# Patient Record
Sex: Male | Born: 1965
Health system: Southern US, Community
[De-identification: ages and names within clinical notes are randomized; demographics above are authoritative.]

## PROBLEM LIST (undated history)

## (undated) DIAGNOSIS — E782 Mixed hyperlipidemia: Secondary | ICD-10-CM

## (undated) HISTORY — PX: SHOULDER SURGERY: SHX246

## (undated) HISTORY — DX: Mixed hyperlipidemia: E78.2

## (undated) HISTORY — PX: HIP SURGERY: SHX245

---

## 1999-01-23 ENCOUNTER — Emergency Department (HOSPITAL_COMMUNITY): Admission: EM | Admit: 1999-01-23 | Discharge: 1999-01-23 | Payer: Self-pay | Admitting: Emergency Medicine

## 1999-01-23 ENCOUNTER — Encounter: Payer: Self-pay | Admitting: Emergency Medicine

## 2003-06-09 ENCOUNTER — Emergency Department (HOSPITAL_COMMUNITY): Admission: EM | Admit: 2003-06-09 | Discharge: 2003-06-09 | Payer: Self-pay | Admitting: Emergency Medicine

## 2012-09-27 ENCOUNTER — Ambulatory Visit: Payer: Self-pay

## 2012-09-27 ENCOUNTER — Ambulatory Visit: Payer: Self-pay | Admitting: Family Medicine

## 2012-09-27 DIAGNOSIS — R109 Unspecified abdominal pain: Secondary | ICD-10-CM

## 2012-09-27 MED ORDER — PREDNISONE 20 MG PO TABS: ORAL_TABLET | ORAL | Status: DC

## 2012-09-27 NOTE — Progress Notes (Signed)
  Subjective:    Patient ID: Alex Villegas, male    DOB: 02-May-1965, 47 y.o.   MRN: [id number]  HPI 47 y.o. Male presents to clinic with lower abdominal pain that he has noticed for the past two months. Pain predominantly in the center of lower abdomen. Has slight pain with coughing. For the past two weeks that pain has been feeling worse intromittently. Denies any nausea, vomiting.  Has some pain with walking. Patient playing soccer, denies having been hit.   Review of Systems     Objective:   Physical Exam Very tender symphysis pubis Abdomen otherwise negative Normal hip ROM [facility] reading (PRIMARY) by  Dr. Milus Glazier: small irregularity right pubic symphysis..         Assessment & Plan:  Lower abdominal pain, unspecified laterality - Plan: DG Pelvis 1-2 Views, predniSONE (DELTASONE) 20 MG tablet Strained rectus at symphysis Signed, Elvina Sidle, MD

## 2012-12-14 ENCOUNTER — Other Ambulatory Visit: Payer: Self-pay | Admitting: Urology

## 2012-12-14 DIAGNOSIS — M25551 Pain in right hip: Secondary | ICD-10-CM

## 2012-12-16 ENCOUNTER — Ambulatory Visit
Admission: RE | Admit: 2012-12-16 | Discharge: 2012-12-16 | Disposition: A | Discharge status: Home / Self Care | Payer: PRIVATE HEALTH INSURANCE | Source: Ambulatory Visit | Attending: Urology | Admitting: Urology

## 2012-12-16 DIAGNOSIS — M25551 Pain in right hip: Secondary | ICD-10-CM

## 2015-03-21 LAB — LIPID PANEL
Cholesterol: 243 — AB (ref 0–200)
HDL: 35 (ref 35–70)
LDL CALC: 137
Triglycerides: 351 — AB (ref 40–160)

## 2015-03-21 LAB — BASIC METABOLIC PANEL: Glucose: 109

## 2015-10-28 DIAGNOSIS — M1611 Unilateral primary osteoarthritis, right hip: Secondary | ICD-10-CM | POA: Insufficient documentation

## 2016-05-04 LAB — HM COLONOSCOPY

## 2016-07-22 LAB — LIPID PANEL
CHOLESTEROL: 247 — AB (ref 0–200)
HDL: 30 — AB (ref 35–70)
LDL Cholesterol: 162
Triglycerides: 276 — AB (ref 40–160)

## 2016-07-22 LAB — BASIC METABOLIC PANEL: GLUCOSE: 87

## 2016-10-05 ENCOUNTER — Ambulatory Visit (INDEPENDENT_AMBULATORY_CARE_PROVIDER_SITE_OTHER): Payer: Commercial Managed Care - PPO | Admitting: Physician Assistant

## 2016-10-05 ENCOUNTER — Encounter: Payer: Self-pay | Admitting: Physician Assistant

## 2016-10-05 VITALS — BP 110/70 | HR 52 | Temp 97.8°F | Ht 69.5 in | Wt 172.4 lb

## 2016-10-05 DIAGNOSIS — R0789 Other chest pain: Secondary | ICD-10-CM

## 2016-10-05 DIAGNOSIS — Z114 Encounter for screening for human immunodeficiency virus [HIV]: Secondary | ICD-10-CM | POA: Diagnosis not present

## 2016-10-05 DIAGNOSIS — E782 Mixed hyperlipidemia: Secondary | ICD-10-CM | POA: Diagnosis not present

## 2016-10-05 LAB — LIPID PANEL
Cholesterol: 210 mg/dL — ABNORMAL HIGH (ref 0–200)
HDL: 37.1 mg/dL — ABNORMAL LOW (ref >39.00)
LDL Cholesterol: 133 mg/dL — ABNORMAL HIGH (ref 0–99)
NonHDL: 172.85
Total CHOL/HDL Ratio: 6
Triglycerides: 199 mg/dL — ABNORMAL HIGH (ref 0.0–149.0)
VLDL: 39.8 mg/dL (ref 0.0–40.0)

## 2016-10-05 LAB — CBC WITH DIFFERENTIAL/PLATELET
Basophils Absolute: 0 10*3/uL (ref 0.0–0.1)
Basophils Relative: 0.8 % (ref 0.0–3.0)
Eosinophils Absolute: 0.1 10*3/uL (ref 0.0–0.7)
Eosinophils Relative: 1.7 % (ref 0.0–5.0)
HCT: 43.7 % (ref 39.0–52.0)
Hemoglobin: 15.1 g/dL (ref 13.0–17.0)
Lymphocytes Relative: 49.9 % — ABNORMAL HIGH (ref 12.0–46.0)
Lymphs Abs: 2.2 10*3/uL (ref 0.7–4.0)
MCHC: 34.5 g/dL (ref 30.0–36.0)
MCV: 88.5 fl (ref 78.0–100.0)
Monocytes Absolute: 0.3 10*3/uL (ref 0.1–1.0)
Monocytes Relative: 6.6 % (ref 3.0–12.0)
Neutro Abs: 1.8 10*3/uL (ref 1.4–7.7)
Neutrophils Relative %: 41 % — ABNORMAL LOW (ref 43.0–77.0)
Platelets: 169 10*3/uL (ref 150.0–400.0)
RBC: 4.94 Mil/uL (ref 4.22–5.81)
RDW: 13.2 % (ref 11.5–15.5)
WBC: 4.4 10*3/uL (ref 4.0–10.5)

## 2016-10-05 LAB — COMPREHENSIVE METABOLIC PANEL
ALT: 21 U/L (ref 0–53)
AST: 17 U/L (ref 0–37)
Albumin: 4.5 g/dL (ref 3.5–5.2)
Alkaline Phosphatase: 75 U/L (ref 39–117)
BUN: 16 mg/dL (ref 6–23)
CO2: 28 mEq/L (ref 19–32)
Calcium: 9.4 mg/dL (ref 8.4–10.5)
Chloride: 103 mEq/L (ref 96–112)
Creatinine, Ser: 1.03 mg/dL (ref 0.40–1.50)
GFR: 81.04 mL/min (ref >60.00)
Glucose, Bld: 93 mg/dL (ref 70–99)
Potassium: 4 mEq/L (ref 3.5–5.1)
Sodium: 140 mEq/L (ref 135–145)
Total Bilirubin: 0.8 mg/dL (ref 0.2–1.2)
Total Protein: 7.1 g/dL (ref 6.0–8.3)

## 2016-10-05 LAB — TSH: TSH: 1.22 u[IU]/mL (ref 0.35–4.50)

## 2016-10-05 NOTE — Patient Instructions (Signed)
It was great to meet you!  We will call you with your lab results.  If you develop any changes in your chest pain or more severe symptoms, please go to the emergency room.

## 2016-10-05 NOTE — Progress Notes (Signed)
Alex Villegas is a 51 y.o. male here to Establish Care  I acted as a Neurosurgeon for [employer], PA-C Alex Mull, LPN  History of Present Illness:   Chief Complaint  Patient presents with  . Establish Care    Generic Commercial    Acute Concerns: Patient presents with complaints of chest pain.  Provocative/Palliative factors: breathing deep makes it better, nothing makes worse Quality factors: dull Radiation: no radiation Risk factors: no family history, does not smoke, 1 cup of coffee daily, couple of beers weekly, endorses very little stress in life Severity (1/10): 1-2 Site of pain: chest Timing: no pattern, never wakes up out of sleep Constant or intermittent: intermittent Abrupt or gradual onset: gradual Other sx: no diaphoresis, nausea, dyspnea  Has this happened before? Has been dealing with for at least 10 years, 3 years ago went to [place], had EKG at cardiologist office, was told it was normal  He works for [employer] They do annual nursing visits where they get vitals and blood work done. His most recent lipid panel shows LDL in the 160's, the year prior was in the 130's. He has taken fish oil in the past, does not know dosage or when he last took any. He plays soccer for exercise, does not have any symptoms with this.  Chronic Issues: None  Health Maintenance: Immunizations -- declined Tdap and flu shot Colonoscopy -- requesting records, was completed this year --- told to repeat in 5 years PSA -- reports that he had PSA completed at last physical, approx 3-4 months ago Diet -- "[place] foods", eats all food groups, drinks mostly sweet tea and orange juice Caffeine intake -- 1 coffee daily Exercise -- soccer a few days a week Weight -- Weight: 172 lb 6.1 oz (78.2 kg)  Mood -- denies stress/anxiety  Depression screen PHQ 2/9 10/05/2016  Decreased Interest 0  Down, Depressed, Hopeless 0  PHQ - 2 Score 0    No flowsheet data found.  Other  providers/specialists: Saw cardiologist in [place] a few years ago  History reviewed. No pertinent past medical history.   Social History   Social History  . Marital status: Single    Spouse name: N/A  . Number of children: N/A  . Years of education: N/A   Occupational History  . Not on file.   Social History Main Topics  . Smoking status: Never Smoker  . Smokeless tobacco: Never Used  . Alcohol use 0.6 oz/week    1 Cans of beer per week  . Drug use: No  . Sexual activity: No   Other Topics Concern  . Not on file   Social History Narrative   Single   2 children   Works at [employer], Artist       Past Surgical History:  Procedure Laterality Date  . HIP SURGERY Right   . SHOULDER SURGERY Right     Family History  Problem Relation Age of Onset  . Diabetes Mother   . Diabetes Sister     No Known Allergies   Current Medications:   Current Outpatient Prescriptions:  .  acetaminophen (TYLENOL) 325 MG tablet, Take 650 mg by mouth as needed., Disp: , Rfl:    Review of Systems:   Review of Systems  Constitutional: Negative for chills, fever, malaise/fatigue and weight loss.  Respiratory: Negative for shortness of breath.   Cardiovascular: Positive for chest pain. Negative for palpitations, orthopnea, claudication and leg swelling.  Gastrointestinal: Negative for  heartburn, nausea and vomiting.  Neurological: Negative for dizziness, tingling and headaches.  Psychiatric/Behavioral: The patient is not nervous/anxious and does not have insomnia.     Vitals:   Vitals:   10/05/16 1020  BP: 110/70  Pulse: (!) 52  Temp: 97.8 F (36.6 C)  TempSrc: Oral  SpO2: 96%  Weight: 172 lb 6.1 oz (78.2 kg)  Height: 5' 9.5" (1.765 m)     Body mass index is 25.09 kg/m.  Physical Exam:   Physical Exam  Constitutional: He appears well-developed. He is cooperative.  Non-toxic appearance. He does not have a sickly appearance. He does not appear ill. No  distress.  Cardiovascular: Regular rhythm, S1 normal, S2 normal, normal heart sounds and normal pulses.  Bradycardia present.   No LE edema  Pulmonary/Chest: Effort normal and breath sounds normal.  Neurological: He is alert. GCS eye subscore is 4. GCS verbal subscore is 5. GCS motor subscore is 6.  Skin: Skin is warm, dry and intact.  Psychiatric: He has a normal mood and affect. His speech is normal and behavior is normal.  Nursing note and vitals reviewed.  EKG tracing is personally reviewed.  EKG notes NSR with bradycardia.  No acute changes.   Assessment and Plan:    Alex Villegas was seen today for establish care.  Diagnoses and all orders for this visit:  Chest discomfort This is a chronic issue for patient. Currently rates his pain as 1-2/10. Non-exertional. No family cardiac history. Denies anxiety. Will obtain labs today. Reviewed ER precautions. EKG tracing is personally reviewed.  EKG notes NSR with bradycardia.  No acute changes. Consider cardiology referral if symptoms persist. -     CBC with Differential/Platelet -     Comprehensive metabolic panel -     TSH -     EKG 12-Lead  Elevated cholesterol with elevated triglycerides Will check fasting lipid panel today. Patient is agreeable to starting fish oil if needed. Will calculate ASCVD+ when labs return and assess need for statin. -     Lipid panel  Encounter for screening for HIV Agreeable to one time screening for HIV. -     HIV antibody   . Reviewed expectations re: course of current medical issues. . Discussed self-management of symptoms. . Outlined signs and symptoms indicating need for more acute intervention. . Patient verbalized understanding and all questions were answered. . See orders for this visit as documented in the electronic medical record. . Patient received an After-Visit Summary.   CMA or LPN served as scribe during this visit. History, Physical, and Plan performed by medical provider. Documentation  and orders reviewed and attested to.   Alex Motto, PA-C

## 2016-10-06 ENCOUNTER — Encounter: Payer: Self-pay | Admitting: Physician Assistant

## 2016-10-06 LAB — HIV ANTIBODY (ROUTINE TESTING W REFLEX): HIV 1&2 Ab, 4th Generation: NONREACTIVE

## 2016-10-07 ENCOUNTER — Telehealth: Payer: Self-pay | Admitting: *Deleted

## 2016-10-07 NOTE — Telephone Encounter (Signed)
Patient called and is returning a left voice message regarding his lab results. Patient states he will call back tomorrow morning. Thank you

## 2016-10-07 NOTE — Telephone Encounter (Signed)
Patient called and states he is returning a message left for him regarding results, Patient is requesting a call back. . His contact number is [phone]. Please advise. Thank you

## 2016-10-09 ENCOUNTER — Other Ambulatory Visit: Payer: Self-pay | Admitting: Physician Assistant

## 2016-10-09 DIAGNOSIS — E782 Mixed hyperlipidemia: Secondary | ICD-10-CM

## 2016-10-09 DIAGNOSIS — R001 Bradycardia, unspecified: Secondary | ICD-10-CM

## 2016-10-09 NOTE — Telephone Encounter (Signed)
See result notes. 

## 2016-11-18 ENCOUNTER — Encounter: Payer: Self-pay | Admitting: Cardiology

## 2016-11-18 ENCOUNTER — Ambulatory Visit: Payer: Commercial Managed Care - PPO | Admitting: Cardiology

## 2016-11-18 VITALS — BP 110/76 | HR 62 | Ht 70.0 in | Wt 173.0 lb

## 2016-11-18 DIAGNOSIS — E782 Mixed hyperlipidemia: Secondary | ICD-10-CM | POA: Insufficient documentation

## 2016-11-18 DIAGNOSIS — R079 Chest pain, unspecified: Secondary | ICD-10-CM

## 2016-11-18 NOTE — Patient Instructions (Addendum)
No medication changes    Schedule at [facility] Your physician has requested that you have cardiac CT calcium scoring. Cardiac computed tomography (CT) is a painless test that uses an x-ray machine to take clear, detailed pictures of your heart. For further information please visit [email or web address]. Please follow instruction sheet as given.  THERE WILL BE $150 CHARGE THAT IS NOT COVERED BY INSURANCE.     Your physician recommends that you schedule a follow-up appointment in 1 MONTH WITH DR HARDING.   .Marland Kitchen

## 2016-11-18 NOTE — Progress Notes (Signed)
PCP: Jarold MottoWorley, Samantha, PA  Clinic Note: Chief Complaint  Patient presents with  . New Patient (Initial Visit)    chest pain    HPI: Alex Villegas is a 51 y.o. male who is being seen today for the evaluation of chest discomfort at the request of Jarold MottoWorley, Samantha, [place].  Alex Villegas was seen on October 1 by Jarold MottoWorley, Samantha, [place] -he noted dealing with chest discomfort for about 10 years.  Was noticing 1-2/10 chest pain at that time.  Exertional in nature.  She showed sinus bradycardia but otherwise normal years ago he was evaluated in cardiologist office in [place] -- the EKG was normal  Recent Hospitalizations: n/a  Studies Personally Reviewed - (if available, images/films reviewed: From Epic Chart or Care Everywhere)  none  Interval History: Alex Villegas presents here today for evaluation of episodic chest discomfort has been off and on for the last several years, but worse over the last couple months.  He is a very active, plays soccer all the time and has no major issues with playing soccer, but of late in the last 3-4 days he has been noticing having some chest discomfort that comes out of the blue.  It can happen both at rest and also sometimes with exertion.  Sometimes he notes it gets worse with exertion and others at times he does not.  When she does feel the pain and it is a sharp stabbing type pain in the center of his chest.  It resolved spontaneously.  He does appear to be better when he takes a deep breath in. He says he notes it more at rest, but if he has the symptom it will get worse if he exerts. He does not have any palpitations or dyspnea associated, nor nausea vomiting.  He has not had any heart failure symptoms of PND, orthopnea or edema. Besides an episode about 6 months ago where he had profound tunnel vision and lightheadedness  -> noting that he went out and played a soccer game without having had enough to drink or eat, he has not had any further episodes of  lightheadedness, dizziness or syncope/near syncope. No TIA/amaurosis fugax symptoms. No melena, hematochezia, hematuria, or epstaxis. No claudication.  ROS: A comprehensive was performed. Review of Systems  Constitutional: Negative for malaise/fatigue.  HENT: Positive for congestion (Sometimes with allergies, but not currently).   Eyes: Negative.   Respiratory: Negative for cough, shortness of breath and wheezing.   Gastrointestinal: Negative for abdominal pain and heartburn.  Musculoskeletal: Positive for joint pain (He has injury to his right ankle from a soccer game.).  Neurological: Negative for dizziness.  Psychiatric/Behavioral: Negative.   All other systems reviewed and are negative.  I have reviewed and (if needed) personally updated the patient's problem list, medications, allergies, past medical and surgical history, social and family history.   Past Medical History:  Diagnosis Date  . Hyperlipidemia, mixed    Total cholesterol 210, LDL 133, triglycerides 199 HDL 37    Past Surgical History:  Procedure Laterality Date  . HIP SURGERY Right   . SHOULDER SURGERY Right     Current Meds  Medication Sig  . acetaminophen (TYLENOL) 325 MG tablet Take 650 mg by mouth as needed.  -He does not take any prescription medications  No Known Allergies  Social History   Socioeconomic History  . Marital status: Single    Spouse name: None  . Number of children: None  . Years of education: None  .  Highest education level: None  Social Needs  . Financial resource strain: None  . Food insecurity - worry: None  . Food insecurity - inability: None  . Transportation needs - medical: None  . Transportation needs - non-medical: None  Occupational History  . None  Tobacco Use  . Smoking status: Current Some Day Smoker  . Smokeless tobacco: Never Used  Substance and Sexual Activity  . Alcohol use: Yes    Alcohol/week: 0.6 oz    Types: 1 Cans of beer per week  . Drug use: No   . Sexual activity: No    Birth control/protection: Abstinence  Other Topics Concern  . 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  Wt Readings from Last 3 Encounters:  11/18/16 173 lb (78.5 kg)  10/05/16 172 lb 6.1 oz (78.2 kg)  09/27/12 175 lb (79.4 kg)    PHYSICAL EXAM BP 110/76   Pulse 62   Ht 5\' 10"  (1.778 m)   Wt 173 lb (78.5 kg)   BMI 24.82 kg/m  Physical Exam  Constitutional: He is oriented to person, place, and time. He appears well-developed and well-nourished. No distress.  HENT:  Head: Normocephalic and atraumatic.  Eyes: EOM are normal. Pupils are equal, round, and reactive to light. No scleral icterus.  Neck: Normal range of motion. Neck supple. No hepatojugular reflux and no JVD present. Carotid bruit is not present. No tracheal deviation present. No thyromegaly present.  Cardiovascular: Normal rate, regular rhythm, normal heart sounds and intact distal pulses.  No extrasystoles are present. PMI is not displaced. Exam reveals no gallop and no friction rub.  No murmur heard. Pulmonary/Chest: Effort normal and breath sounds normal. No respiratory distress. He has no wheezes. He has no rales.  Abdominal: Soft. Bowel sounds are normal. He exhibits no distension. There is no tenderness. There is no rebound.  Musculoskeletal: Normal range of motion. He exhibits no edema.  Neurological: He is alert and oriented to person, place, and time. No cranial nerve deficit.  Skin: Skin is warm and dry. No rash noted. No erythema.  Psychiatric: He has a normal mood and affect. His behavior is normal. Judgment and thought content normal.  Nursing note and vitals reviewed.    Adult ECG  Report  Rate: 62;  Rhythm: normal sinus rhythm and Normal axis, intervals and durations.;   Narrative Interpretation: Normal EKG   Other studies Reviewed: Additional studies/ records that were reviewed today include:  Recent Labs:   Lab Results  Component Value Date   CHOL 210 (H) 10/05/2016   HDL 37.10 (L) 10/05/2016   LDLCALC 133 (H) 10/05/2016   TRIG 199.0 (H) 10/05/2016   CHOLHDL 6 10/05/2016   Lab Results  Component Value Date   CREATININE 1.03 10/05/2016   BUN 16 10/05/2016   NA 140 10/05/2016   K 4.0 10/05/2016   CL 103 10/05/2016   CO2 28 10/05/2016    ASSESSMENT / PLAN:  Otherwise healthy gentleman with no significant cardiac history to speak of who is now presented with some typical and otherwise atypical chest discomfort symptoms.  The fact that he is able to play soccer  most days without having this discomfort makes it less likely for to be significant cardiac disease.  Plan for now will be baseline risk stratification with cardiac calcium scoring.  -->  If abnormal, would then proceed with coronary CT angiogram and possible CT FFR versus Myoview stress test.  He does have hyperlipidemia, and based on the results of the calcium score, may need to consider closer monitoring and treatment.  Problem List Items Addressed This Visit    Chest pain with low risk for cardiac etiology - Primary   Relevant Orders   EKG 12-Lead (Completed)   CT CARDIAC SCORING   Hyperlipidemia, mixed      Current medicines are reviewed at length with the patient today. (+/- concerns) none The following changes have been made:None  Patient Instructions  No medication changes    Schedule at [facility] [address] Your physician has requested that you have cardiac CT calcium scoring. Cardiac computed tomography (CT) is a painless test that uses an x-ray machine to take clear, detailed pictures of your heart. For further information please visit [email or web address]  Please follow instruction sheet as given.  THERE WILL BE $150 CHARGE THAT IS NOT COVERED BY INSURANCE.     Your physician recommends that you schedule a follow-up appointment in 1 MONTH WITH DR HARDING.   .     Studies Ordered:   Orders Placed This Encounter  Procedures  . CT CARDIAC SCORING  . EKG 12-Lead      Bryan Lemma, M.D., M.S. Interventional Cardiologist   Pager # [phone] Phone # [phone] [address] [place], [place] [address]

## 2016-11-20 ENCOUNTER — Encounter: Payer: Self-pay | Admitting: Cardiology

## 2016-11-25 ENCOUNTER — Other Ambulatory Visit: Payer: Commercial Managed Care - PPO

## 2016-12-16 ENCOUNTER — Ambulatory Visit: Payer: Commercial Managed Care - PPO | Admitting: Cardiology

## 2017-04-15 ENCOUNTER — Encounter: Payer: Self-pay | Admitting: Family Medicine

## 2017-04-15 ENCOUNTER — Telehealth: Payer: Self-pay

## 2017-04-15 ENCOUNTER — Ambulatory Visit (INDEPENDENT_AMBULATORY_CARE_PROVIDER_SITE_OTHER): Payer: Commercial Managed Care - PPO | Admitting: Family Medicine

## 2017-04-15 VITALS — BP 118/78 | HR 60 | Temp 98.1°F | Ht 70.0 in | Wt 188.0 lb

## 2017-04-15 DIAGNOSIS — G509 Disorder of trigeminal nerve, unspecified: Secondary | ICD-10-CM | POA: Diagnosis not present

## 2017-04-15 MED ORDER — PREDNISONE 50 MG PO TABS: ORAL_TABLET | ORAL | 0 refills | Status: DC
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
## 2017-06-04 ENCOUNTER — Ambulatory Visit (INDEPENDENT_AMBULATORY_CARE_PROVIDER_SITE_OTHER): Payer: Commercial Managed Care - PPO

## 2017-06-04 ENCOUNTER — Encounter: Payer: Self-pay | Admitting: Physician Assistant

## 2017-06-04 ENCOUNTER — Ambulatory Visit (INDEPENDENT_AMBULATORY_CARE_PROVIDER_SITE_OTHER): Payer: Commercial Managed Care - PPO | Admitting: Physician Assistant

## 2017-06-04 VITALS — BP 116/76 | HR 52 | Temp 97.9°F | Ht 69.75 in | Wt 171.2 lb

## 2017-06-04 DIAGNOSIS — M79645 Pain in left finger(s): Secondary | ICD-10-CM | POA: Diagnosis not present

## 2017-06-04 DIAGNOSIS — S99911A Unspecified injury of right ankle, initial encounter: Secondary | ICD-10-CM | POA: Diagnosis not present

## 2017-06-04 DIAGNOSIS — G8929 Other chronic pain: Secondary | ICD-10-CM | POA: Diagnosis not present

## 2017-06-04 DIAGNOSIS — Z125 Encounter for screening for malignant neoplasm of prostate: Secondary | ICD-10-CM | POA: Diagnosis not present

## 2017-06-04 DIAGNOSIS — E782 Mixed hyperlipidemia: Secondary | ICD-10-CM | POA: Diagnosis not present

## 2017-06-04 DIAGNOSIS — Z0001 Encounter for general adult medical examination with abnormal findings: Secondary | ICD-10-CM

## 2017-06-04 LAB — COMPREHENSIVE METABOLIC PANEL
ALT: 18 U/L (ref 0–53)
AST: 13 U/L (ref 0–37)
Albumin: 4.4 g/dL (ref 3.5–5.2)
Alkaline Phosphatase: 74 U/L (ref 39–117)
BUN: 15 mg/dL (ref 6–23)
CALCIUM: 9.4 mg/dL (ref 8.4–10.5)
CHLORIDE: 102 meq/L (ref 96–112)
CO2: 30 meq/L (ref 19–32)
Creatinine, Ser: 1.03 mg/dL (ref 0.40–1.50)
GFR: 80.82 mL/min (ref >60.00)
GLUCOSE: 88 mg/dL (ref 70–99)
Potassium: 4.1 mEq/L (ref 3.5–5.1)
Sodium: 139 mEq/L (ref 135–145)
Total Bilirubin: 0.7 mg/dL (ref 0.2–1.2)
Total Protein: 7.3 g/dL (ref 6.0–8.3)

## 2017-06-04 LAB — LIPID PANEL
CHOL/HDL RATIO: 7
CHOLESTEROL: 229 mg/dL — AB (ref 0–200)
HDL: 33.7 mg/dL — AB (ref >39.00)

## 2017-06-04 LAB — CBC
HCT: 43.6 % (ref 39.0–52.0)
HEMOGLOBIN: 15.2 g/dL (ref 13.0–17.0)
MCHC: 34.8 g/dL (ref 30.0–36.0)
MCV: 87.4 fl (ref 78.0–100.0)
PLATELETS: 171 10*3/uL (ref 150.0–400.0)
RBC: 4.98 Mil/uL (ref 4.22–5.81)
RDW: 13.1 % (ref 11.5–15.5)
WBC: 4.8 10*3/uL (ref 4.0–10.5)

## 2017-06-04 LAB — LDL CHOLESTEROL, DIRECT: LDL DIRECT: 95 mg/dL

## 2017-06-04 LAB — PSA: PSA: 0.3 ng/mL (ref 0.10–4.00)

## 2017-06-04 NOTE — Progress Notes (Signed)
I acted as a Neurosurgeon for [employer], PA-C Corky Mull, LPN  Subjective:    Alex Villegas is a 52 y.o. male and is here for a comprehensive physical exam.  HPI  Health Maintenance Due  Topic Date Due  . COLONOSCOPY  02/14/2016    Acute Concerns: R ankle injury -- 6 months ago got kicked in medial malleolus while playing soccer. Did not seek medical attention. Area had a laceration. Was able to bear weight after injury. He is concerned because he feels as though he has increased sensitivity to this area and is concerned about the swelling/deformity that has resulted in the area. Denies numbness/tingling to R foot or any radiation of pain. Has had past ankle sprains/strains but denies prior fracture, to his knowledge. L thumb pain -- he is right handed. 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 He has to use his left hand to hold small objects while using drill with R hand. Over the past few days he has had L thumb pain and it is worsened with activity of his job, holding small objects still for long periods of time. Denies numbness/tingling to area or decreased strength.  Chronic Issues: Hyperlipidemia -- currently not on medication, will re-check fasting lipid panel today. He is planning to get calcium scoring done soon. Saw Dr. Herbie Baltimore on 11/18/16 for cardiac risk evaluation.  Health Maintenance: Immunizations -- UTD Colonoscopy -- done 2018, release signed will obtain records PSA -- plan to re-check today, denies issues Diet -- eats all food groups Caffeine intake -- 1 cup daily Sleep habits -- sleep is good Exercise -- soccer weekly; occasional exercise at home Weight -- Weight: 171 lb 3.2 oz (77.7 kg)  Weight history Wt Readings from Last 10 Encounters:  06/04/17 171 lb 3.2 oz (77.7 kg)  04/15/17 188 lb (85.3 kg)  11/18/16 173 lb (78.5 kg)  10/05/16 172 lb 6.1 oz (78.2 kg)  09/27/12 175 lb (79.4 kg)  Mood -- gets irritated easily Tobacco use --  None Alcohol use --- 2-3 bottles of beer per week  Depression screen [facility] 2/9 06/04/2017  Decreased Interest 0  Down, Depressed, Hopeless 0  PHQ - 2 Score 0   Other providers/specialists: Dr. Herbie Baltimore -- cardiologist  PMHx, SurgHx, SocialHx, Medications, and Allergies were reviewed in the Visit Navigator and updated as appropriate.   Past Medical History:  Diagnosis Date  . Hyperlipidemia, mixed    Total cholesterol 210, LDL 133, triglycerides 199 HDL 37     Past Surgical History:  Procedure Laterality Date  . HIP SURGERY Right   . SHOULDER SURGERY Right      Family History  Problem Relation Age of Onset  . Diabetes Mother   . Diabetes Sister     Social History   Tobacco Use  . Smoking status: Never Smoker  . Smokeless tobacco: Never Used  Substance Use Topics  . Alcohol use: Yes    Alcohol/week: 0.6 oz    Types: 1 Cans of beer per week  . Drug use: No    Review of Systems:   Review of Systems  Constitutional: Negative.  Negative for chills, fever, malaise/fatigue and weight loss.  HENT: Negative.  Negative for hearing loss, sinus pain and sore throat.   Eyes: Negative.  Negative for blurred vision.  Respiratory: Negative.  Negative for cough and shortness of breath.   Cardiovascular: Negative.  Negative for chest pain, palpitations and leg swelling.  Gastrointestinal: Negative.  Negative  for abdominal pain, constipation, diarrhea, heartburn, nausea and vomiting.  Genitourinary: Negative.  Negative for dysuria, frequency and urgency.  Musculoskeletal: Negative.  Negative for back pain, myalgias and neck pain.  Skin: Negative.  Negative for itching and rash.  Neurological: Negative.  Negative for dizziness, tingling, seizures, loss of consciousness and headaches.  Endo/Heme/Allergies: Negative.  Negative for polydipsia.  Psychiatric/Behavioral: Negative.  Negative for depression. The patient is not nervous/anxious.     Objective:   Vitals:   06/04/17 0958   BP: 116/76  Pulse: (!) 52  Temp: 97.9 F (36.6 C)  SpO2: 96%   Body mass index is 24.74 kg/m.  General Appearance:  Alert, cooperative, no distress, appears stated age  Head:  Normocephalic, without obvious abnormality, atraumatic  Eyes:  PERRL, conjunctiva/corneas clear, EOM's intact, fundi benign, both eyes       Ears:  Normal TM's and external ear canals, both ears  Nose: Nares normal, septum midline, mucosa normal, no drainage    or sinus tenderness  Throat: Lips, mucosa, and tongue normal; teeth and gums normal  Neck: Supple, symmetrical, trachea midline, no adenopathy; thyroid:  No enlargement/tenderness/nodules; no carotit bruit or JVD  Back:   Symmetric, no curvature, ROM normal, no CVA tenderness  Lungs:   Clear to auscultation bilaterally, respirations unlabored  Chest wall:  No tenderness or deformity  Heart:  Regular rate and rhythm, S1 and S2 normal, no murmur, rub   or gallop  Abdomen:   Soft, non-tender, bowel sounds active all four quadrants, no masses, no organomegaly  Extremities: Extremities without cyanosis or edema Right medial malleolus with small bony protuberance, no erythema or tenderness with palpation. Left thumb without any swelling, erythema, ecchymosis.  Unable to reproduce symptoms with palpation or movement.  Negative Finkelstein's.  Prostate: Not done.   Skin: Skin color, texture, turgor normal, no rashes or lesions  Lymph nodes: Cervical, supraclavicular, and axillary nodes normal  Neurologic: CNII-XII grossly intact. Normal strength, sensation and reflexes throughout   CLINICAL DATA:  52 year old male with medial malleolus pain following injury 6 months ago.  EXAM: RIGHT ANKLE - COMPLETE 3+ VIEW  COMPARISON:  None.  FINDINGS: No joint effusion identified. Mortise joint alignment preserved. Chronic appearing irregularity about the tip of the medial malleolus, and also the neck of the talus. Talar dome intact. Bone mineralization is  within normal limits. No acute osseous abnormality identified.  IMPRESSION: Small areas of chronic posttraumatic or degenerative cortical irregularity at the medial malleolus and neck of the talus. No acute osseous abnormality identified.   Electronically Signed   By: Odessa Fleming M.D.   On: 06/04/2017 14:49   Assessment/Plan:   Kari was seen today for annual exam.  Diagnoses and all orders for this visit:  Encounter for general adult medical examination with abnormal findings Today patient counseled on age appropriate routine health concerns for screening and prevention, each reviewed and up to date or declined. Immunizations reviewed and up to date or declined. Labs ordered and reviewed. Risk factors for depression reviewed and negative. Hearing function and visual acuity are intact. ADLs screened and addressed as needed. Functional ability and level of safety reviewed and appropriate. Education, counseling and referrals performed based on assessed risks today. Patient provided with a copy of personalized plan for preventive services.  Prostate cancer screening He is agreeable to this today. -     PSA  Injury of right ankle, initial encounter Ankle x-ray shows evidence of chronic posttraumatic or degenerative cortical irregularity at the  medial malleolus and neck of the talus.  I reviewed this briefly with Dr. Gaspar BiddingMichael Rigby, there is no real intervention for this at this time.  Patient may try some capsaicin cream to help with this. -     DG Ankle Complete Right; Future  Hyperlipidemia, mixed Re-check fasting lipid panel today. -     CBC -     Comprehensive metabolic panel -     Lipid panel  Chronic left thumb pain Briefly discussed with Dr. Gaspar BiddingMichael Rigby.  Patient with likely some CMC arthritis.  Recommend prn ibuprofen and limited activities that exacerbate joint if possible.  May need injection follow-up with Dr. Berline Choughigby of symptoms persist despite treatment.  Well Adult  Exam: Labs ordered: Yes. Patient counseling was done. See below for items discussed. Discussed the patient's BMI.  The BMI BMI is in the acceptable range Follow up as needed for acute illness.  Patient Counseling: [x]   Nutrition: Stressed importance of moderation in sodium/caffeine intake, saturated fat and cholesterol, caloric balance, sufficient intake of fresh fruits, vegetables, and fiber.  [x]   Stressed the importance of regular exercise.   []   Substance Abuse: Discussed cessation/primary prevention of tobacco, alcohol, or other drug use; driving or other dangerous activities under the influence; availability of treatment for abuse.   [x]   Injury prevention: Discussed safety belts, safety helmets, smoke detector, smoking near bedding or upholstery.   []   Sexuality: Discussed sexually transmitted diseases, partner selection, use of condoms, avoidance of unintended pregnancy  and contraceptive alternatives.   [x]   Dental health: Discussed importance of regular tooth brushing, flossing, and dental visits.  [x]   Health maintenance and immunizations reviewed. Please refer to Health maintenance section.    CMA or LPN served as scribe during this visit. History, Physical, and Plan performed by medical provider. Documentation and orders reviewed and attested to.   Jarold MottoSamantha Keilon Ressel, PA-C [facility]

## 2017-06-04 NOTE — Patient Instructions (Addendum)
It was great to see you!  We will contact you with your lab and xray results.  If thumb pain persists, please make an appointment with our sports medicine physician here in our office, Dr. Berline Choughigby.  Please figure out where you had your colonoscopy.   Health Maintenance, Male A healthy lifestyle and preventive care is important for your health and wellness. Ask your health care provider about what schedule of regular examinations is right for you. What should I know about weight and diet? Eat a Healthy Diet  Eat plenty of vegetables, fruits, whole grains, low-fat dairy products, and lean protein.  Do not eat a lot of foods high in solid fats, added sugars, or salt.  Maintain a Healthy Weight Regular exercise can help you achieve or maintain a healthy weight. You should:  Do at least 150 minutes of exercise each week. The exercise should increase your heart rate and make you sweat (moderate-intensity exercise).  Do strength-training exercises at least twice a week.  Watch Your Levels of Cholesterol and Blood Lipids  Have your blood tested for lipids and cholesterol every 5 years starting at 51 years of age. If you are at high risk for heart disease, you should start having your blood tested when you are 51 years old. You may need to have your cholesterol levels checked more often if: ? Your lipid or cholesterol levels are high. ? You are older than 51 years of age. ? You are at high risk for heart disease.  What should I know about cancer screening? Many types of cancers can be detected early and may often be prevented. Lung Cancer  You should be screened every year for lung cancer if: ? You are a current smoker who has smoked for at least 30 years. ? You are a former smoker who has quit within the past 15 years.  Talk to your health care provider about your screening options, when you should start screening, and how often you should be screened.  Colorectal Cancer  Routine  colorectal cancer screening usually begins at 51 years of age and should be repeated every 5-10 years until you are 51 years old. You may need to be screened more often if early forms of precancerous polyps or small growths are found. Your health care provider may recommend screening at an earlier age if you have risk factors for colon cancer.  Your health care provider may recommend using home test kits to check for hidden blood in the stool.  A small camera at the end of a tube can be used to examine your colon (sigmoidoscopy or colonoscopy). This checks for the earliest forms of colorectal cancer.  Prostate and Testicular Cancer  Depending on your age and overall health, your health care provider may do certain tests to screen for prostate and testicular cancer.  Talk to your health care provider about any symptoms or concerns you have about testicular or prostate cancer.  Skin Cancer  Check your skin from head to toe regularly.  Tell your health care provider about any new moles or changes in moles, especially if: ? There is a change in a mole's size, shape, or color. ? You have a mole that is larger than a pencil eraser.  Always use sunscreen. Apply sunscreen liberally and repeat throughout the day.  Protect yourself by wearing long sleeves, pants, a wide-brimmed hat, and sunglasses when outside.  What should I know about heart disease, diabetes, and high blood pressure?  If you  are 36-37 years of age, have your blood pressure checked every 3-5 years. If you are 70 years of age or older, have your blood pressure checked every year. You should have your blood pressure measured twice-once when you are at a hospital or clinic, and once when you are not at a hospital or clinic. Record the average of the two measurements. To check your blood pressure when you are not at a hospital or clinic, you can use: ? An automated blood pressure machine at a pharmacy. ? A home blood pressure  monitor.  Talk to your health care provider about your target blood pressure.  If you are between 23-73 years old, ask your health care provider if you should take aspirin to prevent heart disease.  Have regular diabetes screenings by checking your fasting blood sugar level. ? If you are at a normal weight and have a low risk for diabetes, have this test once every three years after the age of 60. ? If you are overweight and have a high risk for diabetes, consider being tested at a younger age or more often.  A one-time screening for abdominal aortic aneurysm (AAA) by ultrasound is recommended for men aged 87-75 years who are current or former smokers. What should I know about preventing infection? Hepatitis B If you have a higher risk for hepatitis B, you should be screened for this virus. Talk with your health care provider to find out if you are at risk for hepatitis B infection. Hepatitis C Blood testing is recommended for:  Everyone born from [birth year] through 1964.  Anyone with known risk factors for hepatitis C.  Sexually Transmitted Diseases (STDs)  You should be screened each year for STDs including gonorrhea and chlamydia if: ? You are sexually active and are younger than 51 years of age. ? You are older than 51 years of age and your health care provider tells you that you are at risk for this type of infection. ? Your sexual activity has changed since you were last screened and you are at an increased risk for chlamydia or gonorrhea. Ask your health care provider if you are at risk.  Talk with your health care provider about whether you are at high risk of being infected with HIV. Your health care provider may recommend a prescription medicine to help prevent HIV infection.  What else can I do?  Schedule regular health, dental, and eye exams.  Stay current with your vaccines (immunizations).  Do not use any tobacco products, such as cigarettes, chewing tobacco, and  e-cigarettes. If you need help quitting, ask your health care provider.  Limit alcohol intake to no more than 2 drinks per day. One drink equals 12 ounces of beer, 5 ounces of wine, or 1 ounces of hard liquor.  Do not use street drugs.  Do not share needles.  Ask your health care provider for help if you need support or information about quitting drugs.  Tell your health care provider if you often feel depressed.  Tell your health care provider if you have ever been abused or do not feel safe at home. This information is not intended to replace advice given to you by your health care provider. Make sure you discuss any questions you have with your health care provider. Document Released: 06/20/2007 Document Revised: 08/21/2015 Document Reviewed: 09/25/2014 Elsevier Interactive Patient Education  [employer].

## 2017-06-07 NOTE — Progress Notes (Signed)
Please call patient and let them know that most of the labwork that we completed came back normal, including kidney, liver, blood sugar, infection count, and prostate level.  His cholesterol and triglycerides are elevated, and have increased since last visit. Strongly recommend for him to be on a cholesterol medication to lower risk of heart attack and stroke.  Please send in Lipitor 20 mg daily if he is willing to start.  We can recheck in 6 to 12 months.

## 2017-06-08 ENCOUNTER — Other Ambulatory Visit: Payer: Self-pay

## 2017-06-08 MED ORDER — ATORVASTATIN CALCIUM 20 MG PO TABS: 20.0000 mg | ORAL_TABLET | Freq: Every day | ORAL | 1 refills | Status: DC

## 2017-09-24 ENCOUNTER — Other Ambulatory Visit: Payer: Self-pay | Admitting: Physician Assistant

## 2017-10-16 ENCOUNTER — Other Ambulatory Visit: Payer: Self-pay | Admitting: Physician Assistant

## 2017-11-03 ENCOUNTER — Ambulatory Visit (INDEPENDENT_AMBULATORY_CARE_PROVIDER_SITE_OTHER): Payer: Commercial Managed Care - PPO | Admitting: Physician Assistant

## 2017-11-03 ENCOUNTER — Encounter: Payer: Self-pay | Admitting: Physician Assistant

## 2017-11-03 VITALS — BP 102/70 | HR 63 | Temp 98.4°F | Resp 16 | Wt 176.0 lb

## 2017-11-03 DIAGNOSIS — M79641 Pain in right hand: Secondary | ICD-10-CM | POA: Diagnosis not present

## 2017-11-03 DIAGNOSIS — R39198 Other difficulties with micturition: Secondary | ICD-10-CM | POA: Diagnosis not present

## 2017-11-03 DIAGNOSIS — D649 Anemia, unspecified: Secondary | ICD-10-CM | POA: Diagnosis not present

## 2017-11-03 DIAGNOSIS — M79642 Pain in left hand: Secondary | ICD-10-CM | POA: Diagnosis not present

## 2017-11-03 DIAGNOSIS — E782 Mixed hyperlipidemia: Secondary | ICD-10-CM | POA: Diagnosis not present

## 2017-11-03 LAB — CBC WITH DIFFERENTIAL/PLATELET
BASOS ABS: 0 10*3/uL (ref 0.0–0.1)
BASOS PCT: 0.4 % (ref 0.0–3.0)
Eosinophils Absolute: 0.1 10*3/uL (ref 0.0–0.7)
Eosinophils Relative: 2.3 % (ref 0.0–5.0)
HCT: 42.6 % (ref 39.0–52.0)
Hemoglobin: 14.8 g/dL (ref 13.0–17.0)
LYMPHS ABS: 2.6 10*3/uL (ref 0.7–4.0)
Lymphocytes Relative: 51.5 % — ABNORMAL HIGH (ref 12.0–46.0)
MCHC: 34.8 g/dL (ref 30.0–36.0)
MCV: 88.3 fl (ref 78.0–100.0)
MONOS PCT: 6.8 % (ref 3.0–12.0)
Monocytes Absolute: 0.3 10*3/uL (ref 0.1–1.0)
NEUTROS ABS: 1.9 10*3/uL (ref 1.4–7.7)
NEUTROS PCT: 39 % — AB (ref 43.0–77.0)
PLATELETS: 156 10*3/uL (ref 150.0–400.0)
RBC: 4.82 Mil/uL (ref 4.22–5.81)
RDW: 13.2 % (ref 11.5–15.5)
WBC: 5 10*3/uL (ref 4.0–10.5)

## 2017-11-03 LAB — LIPID PANEL
CHOL/HDL RATIO: 4
Cholesterol: 153 mg/dL (ref 0–200)
HDL: 35.3 mg/dL — AB (ref >39.00)
LDL Cholesterol: 80 mg/dL (ref 0–99)
NonHDL: 117.67
Triglycerides: 189 mg/dL — ABNORMAL HIGH (ref 0.0–149.0)
VLDL: 37.8 mg/dL (ref 0.0–40.0)

## 2017-11-03 LAB — CK: Total CK: 139 U/L (ref 7–232)

## 2017-11-03 NOTE — Patient Instructions (Signed)
It was great to see you!  We will touch base with your lab results.  If your urine changes occur more frequently, let's follow-up.  I will reach out to the cardiologist to try to get your CT calcium score scheduled soon.  Take care,  Jarold Motto PA-C

## 2017-11-03 NOTE — Progress Notes (Signed)
Alex Villegas is a 53 y.o. male here for follow-up.  History of Present Illness:   Chief Complaint  Patient presents with  . Hyperlipidemia    HPI   He is here to follow-up on his cholesterol levels. He started Lipitor 20 mg daily and has been taking for almost 6 months. He denies diffuse muscle pains but has noticed that his bilateral middle fingers have intermittent stiffness - no rash, swelling, or pain. Although he also admits that he has had intermittent issues with this for the past 4 years and has not been on a statin until recently. He does note that he doesn't like taking prescription medications and is hoping to not have to take this medication regularly. He saw Dr. Herbie Baltimore in Nov last year and was recommended to have CT scan for calcium scoring but he decided not to do it, now he is interested in this.  He also states that he has been turned away from blood donations because his iron is low. Denies: palpitations, fatigue, obvious blood from rectum  He also states that he has had issues with his urine stream approximately 1 x a week -- has been weak. Denies: urinary pain, pain with BM, pain with ejaculation, blood in urine, nocturia   Past Medical History:  Diagnosis Date  . Hyperlipidemia, mixed    Total cholesterol 210, LDL 133, triglycerides 199 HDL 37     Social History   Socioeconomic History  . Marital status: Single    Spouse name: Not on file  . Number of children: Not on file  . Years of education: Not on file  . Highest education level: Not on file  Occupational History  . Not on file  Social Needs  . Financial resource strain: Not on file  . Food insecurity:    Worry: Not on file    Inability: Not on file  . Transportation needs:    Medical: Not on file    Non-medical: Not on file  Tobacco Use  . Smoking status: Never Smoker  . Smokeless tobacco: Never Used  Substance and Sexual Activity  . Alcohol use: Yes    Alcohol/week: 1.0 standard drinks     Types: 1 Cans of beer per week  . Drug use: No  . Sexual activity: Never    Birth control/protection: Abstinence  Lifestyle  . Physical activity:    Days per week: Not on file    Minutes per session: Not on file  . Stress: Not on file  Relationships  . Social connections:    Talks on phone: Not on file    Gets together: Not on file    Attends religious service: Not on file    Active member of club or organization: Not on file    Attends meetings of clubs or organizations: Not on file    Relationship status: Not on file  . Intimate partner violence:    Fear of current or ex partner: Not on file    Emotionally abused: Not on file    Physically abused: Not on file    Forced sexual activity: Not on file  Other Topics Concern  . Not on file  Social History Narrative   Single, 2 children   Works at [employer], Artist   Diet -- "[employer]", eats all food groups, drinks mostly sweet tea and orange juice    Caffeine intake -- 1 coffee daily    Exercise -- soccer a few days a week  for exercise    Past Surgical History:  Procedure Laterality Date  . HIP SURGERY Right   . SHOULDER SURGERY Right     Family History  Problem Relation Age of Onset  . Diabetes Mother   . Diabetes Sister     No Known Allergies  Current Medications:   Current Outpatient Medications:  .  LIPITOR 20 MG tablet, TAKE 1 TABLET BY MOUTH EVERY DAY, Disp: 90 tablet, Rfl: 0   Review of Systems:   ROS  Negative unless otherwise specified per HPI.  Vitals:   Vitals:   11/03/17 0907  BP: 102/70  Pulse: 63  Resp: 16  Temp: 98.4 F (36.9 C)  TempSrc: Oral  SpO2: 97%  Weight: 176 lb (79.8 kg)     Body mass index is 25.43 kg/m.  Physical Exam:   Physical Exam  Constitutional: He appears well-developed. He is cooperative.  Non-toxic appearance. He does not have a sickly appearance. He does not appear ill. No distress.  Cardiovascular: Normal rate, regular rhythm, S1 normal, S2  normal, normal heart sounds and normal pulses.  No LE edema  Pulmonary/Chest: Effort normal and breath sounds normal.  Musculoskeletal:  Bilateral hands without swelling, decreased ROM, erythema, tenderness of joints  Neurological: He is alert. GCS eye subscore is 4. GCS verbal subscore is 5. GCS motor subscore is 6.  Bilateral grip strength 5/5  Skin: Skin is warm, dry and intact.  Psychiatric: He has a normal mood and affect. His speech is normal and behavior is normal.  Nursing note and vitals reviewed.   Assessment and Plan:    Alex Villegas was seen today for hyperlipidemia.  Diagnoses and all orders for this visit:  Pain in both hands Unclear etiology. He would like to see if stopping the statin will help his symptoms before we pursue further work-up. -     CK (Creatine Kinase)  Hyperlipidemia, mixed Will check lipid panel today. He does not want to continue taking the lipitor. We discussed that it may be necessary to help reduce his overall cardiac risk. Will re-calculate ASCVD. He is now interested in cardiac CT scoring, will defer to cardiology. -     CK (Creatine Kinase) -     Lipid panel  Low hemoglobin This was mentioned on his way out of the office, while in lab. Will check CBC. Will discuss further based on lab results. If he is doing blood donations frequently may need to stop to assess if this is culprit. Also, he is due for a screening colonoscopy. -     CBC with Differential/Platelet  Decreased urinary stream Due to this happening only once a week, will not further work-up at this time. If symptoms increase or change, I recommended follow-up.  . Reviewed expectations re: course of current medical issues. . Discussed self-management of symptoms. . Outlined signs and symptoms indicating need for more acute intervention. . Patient verbalized understanding and all questions were answered. . See orders for this visit as documented in the electronic medical  record. . Patient received an After-Visit Summary.   Jarold Motto, PA-C

## 2017-11-05 ENCOUNTER — Telehealth: Payer: Self-pay | Admitting: Physician Assistant

## 2017-11-05 NOTE — Telephone Encounter (Signed)
Pt given results per Jarold Motto, "no evidence of anemia. Cholesterol and triglycerides have greatly improved since we started the cholesterol medication. His muscle enzymes are normal which tells [place] that his cholesterol medicine isn't likely causing his hand pain.. Follow-up in 6 months."; the pt verbalized understanding and will review his results in my chart; the pt also says that he will call back to make his 6 month's appointment; unable to chart in result note because no encounter created.

## 2017-11-17 ENCOUNTER — Other Ambulatory Visit: Payer: Commercial Managed Care - PPO

## 2017-11-30 ENCOUNTER — Ambulatory Visit (INDEPENDENT_AMBULATORY_CARE_PROVIDER_SITE_OTHER)
Admission: RE | Admit: 2017-11-30 | Discharge: 2017-11-30 | Disposition: A | Discharge status: Home / Self Care | Payer: Self-pay | Source: Ambulatory Visit | Attending: Cardiology | Admitting: Cardiology

## 2017-11-30 DIAGNOSIS — R079 Chest pain, unspecified: Secondary | ICD-10-CM

## 2017-12-09 ENCOUNTER — Telehealth: Payer: Self-pay | Admitting: *Deleted

## 2017-12-09 ENCOUNTER — Encounter: Payer: Self-pay | Admitting: *Deleted

## 2017-12-09 NOTE — Telephone Encounter (Signed)
Left message on both phones-- to call back for results  letter mailed with results

## 2017-12-09 NOTE — Telephone Encounter (Signed)
-----   Message from Marykay Lexavid W Harding, MD sent at 11/30/2017  3:03 PM EST ----- Good news.  Coronary calcium score is 0.  This would be considered low risk for having significant heart artery blockages (atherosclerosis).  Bryan Lemmaavid Harding, MD

## 2018-06-16 ENCOUNTER — Encounter: Payer: Self-pay | Admitting: Physician Assistant

## 2018-06-16 ENCOUNTER — Ambulatory Visit (INDEPENDENT_AMBULATORY_CARE_PROVIDER_SITE_OTHER): Payer: Commercial Managed Care - PPO | Admitting: Physician Assistant

## 2018-06-16 DIAGNOSIS — J029 Acute pharyngitis, unspecified: Secondary | ICD-10-CM

## 2018-06-16 DIAGNOSIS — M25531 Pain in right wrist: Secondary | ICD-10-CM

## 2018-06-16 DIAGNOSIS — M25641 Stiffness of right hand, not elsewhere classified: Secondary | ICD-10-CM

## 2018-06-16 MED ORDER — AMOXICILLIN 875 MG PO TABS: 875.0000 mg | ORAL_TABLET | Freq: Two times a day (BID) | ORAL | 0 refills | Status: DC

## 2018-06-16 NOTE — Progress Notes (Signed)
   TELEPHONE ENCOUNTER   Patient verbally agreed to telephone visit and is aware that copayment and coinsurance may apply. Patient was treated using telemedicine according to accepted telemedicine protocols.  Location of the patient: home Location of provider: [place] Names of all persons participating in the telemedicine service and role in the encounter: [name], [place], Anselmo Pickler LPN, Raiford Simmonds  Subjective:   Chief Complaint  Patient presents with  . Sore Throat     HPI   Sore throat Pt c/o sore throat x 2 weeks. Had slight blood in his saliva this morning. Denies, headaches, cough, fever, ear pain, nasal congestion or post nasal drip. Denies difficulty swallowing. Has not taken any medications for sore throat. Pt has not been exposed to any contacts for COVID-19.  He does snore at night. He doesn't use a humidifier. Doesn't feel like he is drinking enough water, works outside at times.  R middle finger stiffness Pt also c/o having trouble with his middle finger right hand when he bends it, it is hard to put it back in place, stiff and also started having right wrist pain. Can't move the move the finger as well throughout the day. Denies numbness or tingling. Middle finger in right hand with swelling in joints per patient.  Doesn't take medications for pain.  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   Patient Active Problem List   Diagnosis Date Noted  . Chest pain with low risk for cardiac etiology 11/18/2016  . Hyperlipidemia, mixed   . Primary osteoarthritis of right hip 10/28/2015   Social History   Tobacco Use  . Smoking status: Never Smoker  . Smokeless tobacco: Never Used  Substance Use Topics  . Alcohol use: Yes    Alcohol/week: 1.0 standard drinks    Types: 1 Cans of beer per week    Current Outpatient Medications:  .  amoxicillin (AMOXIL) 875 MG tablet, Take 1 tablet (875 mg total) by mouth 2  (two) times daily., Disp: 20 tablet, Rfl: 0 No Known Allergies  Assessment & Plan:    Stiffness of finger joint, right; Right wrist pain Possible overuse injury vs arthritis? Will refer to Charlann Boxer for further work-up and evaluation.       Pharyngitis, unspecified etiology  Given chronicity of symptoms, will start oral amoxicillin for this. Red flags reviewed. I discussed with him that I cannot rule out COVID-19 with these symptoms, however he declined testing. I recommended that he follow-up if any symptoms worsen, change or do not improve.    Orders Placed This Encounter  Procedures  . Ambulatory referral to Sports Medicine   Meds ordered this encounter  Medications  . amoxicillin (AMOXIL) 875 MG tablet    Sig: Take 1 tablet (875 mg total) by mouth 2 (two) times daily.    Dispense:  20 tablet    Refill:  0    Order Specific Question:   Supervising Provider    Answer:   Juleen China, ERICA Spade, PA 06/16/2018  Time spent with the patient: 12 minutes, spent in obtaining information about his symptoms, reviewing his previous labs, evaluations, and treatments, counseling him about his condition (please see the discussed topics above), and developing a plan to further investigate it; he had a number of questions which I addressed.

## 2018-06-30 ENCOUNTER — Ambulatory Visit: Payer: Commercial Managed Care - PPO | Admitting: Family Medicine

## 2018-07-07 ENCOUNTER — Ambulatory Visit (INDEPENDENT_AMBULATORY_CARE_PROVIDER_SITE_OTHER): Payer: Commercial Managed Care - PPO

## 2018-07-07 ENCOUNTER — Telehealth: Payer: Self-pay

## 2018-07-07 ENCOUNTER — Ambulatory Visit: Payer: Commercial Managed Care - PPO | Admitting: Physician Assistant

## 2018-07-07 ENCOUNTER — Encounter: Payer: Self-pay | Admitting: Physician Assistant

## 2018-07-07 ENCOUNTER — Other Ambulatory Visit: Payer: Self-pay

## 2018-07-07 VITALS — BP 116/82 | HR 55 | Temp 97.9°F | Ht 69.75 in | Wt 180.6 lb

## 2018-07-07 DIAGNOSIS — M25571 Pain in right ankle and joints of right foot: Secondary | ICD-10-CM

## 2018-07-07 MED ORDER — MELOXICAM 15 MG PO TABS: 15.0000 mg | ORAL_TABLET | Freq: Every day | ORAL | 0 refills | Status: DC

## 2018-07-07 NOTE — Patient Instructions (Signed)
It was great to see you!  Keep wearing the brace.  Ice is your friend!  Mobic (also called meloxicam) daily for inflammation/swelling. This will replace ibuprofen.  Keep elevated as able.  We will be in touch with xray results.  Take care,  [name] PA-C

## 2018-07-07 NOTE — Progress Notes (Signed)
Alex Villegas is a 53 y.o. male here for a new problem.   History of Present Illness:   Chief Complaint  Patient presents with  . right ankle pain    HPI   Yesterday patient was playing soccer after work.  He everted his R ankle while preparing to kick the ball. He had knee pain at first but now it is gone. He continues to have R ankle pain, and it is improved since yesterday. He did buy a lace-up brace to use so he could go to work today. He has not taken any medication for pain.  No prior fractures of R ankle. Slight swelling today. Pain with walking only if his foot is turned slightly outward.  Denies: numbness/tingling, ecchymosis   Past Medical History:  Diagnosis Date  . Hyperlipidemia, mixed    Total cholesterol 210, LDL 133, triglycerides 199 HDL 37     Social History   Socioeconomic History  . Marital status: Single    Spouse name: Not on file  . Number of children: Not on file  . Years of education: Not on file  . Highest education level: Not on file  Occupational History  . Not on file  Social Needs  . Financial resource strain: Not on file  . Food insecurity    Worry: Not on file    Inability: Not on file  . Transportation needs    Medical: Not on file    Non-medical: Not on file  Tobacco Use  . Smoking status: Never Smoker  . Smokeless tobacco: Never Used  Substance and Sexual Activity  . Alcohol use: Yes    Alcohol/week: 1.0 standard drinks    Types: 1 Cans of beer per week  . Drug use: No  . Sexual activity: Never    Birth control/protection: Abstinence  Lifestyle  . Physical activity    Days per week: Not on file    Minutes per session: Not on file  . Stress: Not on file  Relationships  . Social Musicianconnections    Talks on phone: Not on file    Gets together: Not on file    Attends religious service: Not on file    Active member of club or organization: Not on file    Attends meetings of clubs or organizations: Not on file     Relationship status: Not on file  . Intimate partner violence    Fear of current or ex partner: Not on file    Emotionally abused: Not on file    Physically abused: Not on file    Forced sexual activity: Not on file  Other Topics Concern  . Not on file  Social History Narrative   Single, 2 children   Works at [employer], ArtistAviation Mechanic   Diet -- "[employer] Mutualperuvian foods", eats all food groups, drinks mostly sweet tea and orange juice    Caffeine intake -- 1 coffee daily    Exercise -- soccer a few days a week for exercise    Past Surgical History:  Procedure Laterality Date  . HIP SURGERY Right   . SHOULDER SURGERY Right     Family History  Problem Relation Age of Onset  . Diabetes Mother   . Diabetes Sister     No Known Allergies  Current Medications:   Current Outpatient Medications:  .  meloxicam (MOBIC) 15 MG tablet, Take 1 tablet (15 mg total) by mouth daily., Disp: 30 tablet, Rfl: 0   Review of Systems:  ROS Negative unless otherwise specified per HPI.  Vitals:   Vitals:   07/07/18 1007  BP: 116/82  Pulse: (!) 55  Temp: 97.9 F (36.6 C)  TempSrc: Oral  SpO2: 96%  Weight: 180 lb 9.6 oz (81.9 kg)  Height: 5' 9.75" (1.772 m)     Body mass index is 26.1 kg/m.  Physical Exam:   Physical Exam Vitals signs and nursing note reviewed.  Constitutional:      Appearance: He is well-developed.  HENT:     Head: Normocephalic.  Eyes:     Conjunctiva/sclera: Conjunctivae normal.     Pupils: Pupils are equal, round, and reactive to light.  Neck:     Musculoskeletal: Normal range of motion.  Pulmonary:     Effort: Pulmonary effort is normal.  Musculoskeletal: Normal range of motion.     Comments: Foot & Ankle: Overall foot and ankle are well aligned, no significant deformity. No significant TTP over the base of the 5th metatarsal, navicular. TTP to posterior lateral malleolus and slight swelling to distal fibula    Skin:    General: Skin is warm and dry.   Neurological:     Mental Status: He is alert and oriented to person, place, and time.  Psychiatric:        Behavior: Behavior normal.        Thought Content: Thought content normal.        Judgment: Judgment normal.      Assessment and Plan:   Sarah was seen today for right ankle pain.  Diagnoses and all orders for this visit:  Acute right ankle pain -     DG Ankle Complete Right; Future  Other orders -     meloxicam (MOBIC) 15 MG tablet; Take 1 tablet (15 mg total) by mouth daily.   Suspect sprain, however will check xray to r/o acute bony injury. Continue brace. Ice and elevation as able. Mobic daily as needed for inflammation. Worsening precautions advised.  . Reviewed expectations re: course of current medical issues. . Discussed self-management of symptoms. . Outlined signs and symptoms indicating need for more acute intervention. . Patient verbalized understanding and all questions were answered. . See orders for this visit as documented in the electronic medical record. . Patient received an After-Visit Summary.  [name], PA-C

## 2018-07-07 NOTE — Telephone Encounter (Signed)
Spoke with patient.  States he "turned over his ankle" and would like an xray to look at the bones.  Right ankle pain, mild.  Patient is able to walk.  He is at work now.  Minor swelling.  No bruising to speak of, per patient.  Only interested in xray.  Able to wiggle toes.  No numbness, tingling in foot or lower leg.  No appointments available at [facility] today.  Advised patient someone will call him back to schedule him appointment.  Patient verbalized understanding.

## 2018-07-11 ENCOUNTER — Other Ambulatory Visit: Payer: Self-pay

## 2018-07-11 ENCOUNTER — Ambulatory Visit (INDEPENDENT_AMBULATORY_CARE_PROVIDER_SITE_OTHER): Payer: Commercial Managed Care - PPO | Admitting: Physician Assistant

## 2018-07-11 ENCOUNTER — Encounter: Payer: Self-pay | Admitting: Physician Assistant

## 2018-07-11 VITALS — BP 114/70 | HR 59 | Temp 97.9°F | Ht 69.0 in | Wt 179.5 lb

## 2018-07-11 DIAGNOSIS — E663 Overweight: Secondary | ICD-10-CM

## 2018-07-11 DIAGNOSIS — Z0000 Encounter for general adult medical examination without abnormal findings: Secondary | ICD-10-CM | POA: Diagnosis not present

## 2018-07-11 DIAGNOSIS — E782 Mixed hyperlipidemia: Secondary | ICD-10-CM

## 2018-07-11 DIAGNOSIS — Z23 Encounter for immunization: Secondary | ICD-10-CM | POA: Diagnosis not present

## 2018-07-11 LAB — CBC WITH DIFFERENTIAL/PLATELET
Basophils Absolute: 0 10*3/uL (ref 0.0–0.1)
Basophils Relative: 0.7 % (ref 0.0–3.0)
Eosinophils Absolute: 0.1 10*3/uL (ref 0.0–0.7)
Eosinophils Relative: 2.3 % (ref 0.0–5.0)
HCT: 43.1 % (ref 39.0–52.0)
Hemoglobin: 15.1 g/dL (ref 13.0–17.0)
Lymphocytes Relative: 43.4 % (ref 12.0–46.0)
Lymphs Abs: 2.4 10*3/uL (ref 0.7–4.0)
MCHC: 34.9 g/dL (ref 30.0–36.0)
MCV: 88.6 fl (ref 78.0–100.0)
Monocytes Absolute: 0.4 10*3/uL (ref 0.1–1.0)
Monocytes Relative: 6.6 % (ref 3.0–12.0)
Neutro Abs: 2.6 10*3/uL (ref 1.4–7.7)
Neutrophils Relative %: 47 % (ref 43.0–77.0)
Platelets: 179 10*3/uL (ref 150.0–400.0)
RBC: 4.87 Mil/uL (ref 4.22–5.81)
RDW: 13.6 % (ref 11.5–15.5)
WBC: 5.6 10*3/uL (ref 4.0–10.5)

## 2018-07-11 LAB — COMPREHENSIVE METABOLIC PANEL
ALT: 22 U/L (ref 0–53)
AST: 14 U/L (ref 0–37)
Albumin: 4.3 g/dL (ref 3.5–5.2)
Alkaline Phosphatase: 87 U/L (ref 39–117)
BUN: 17 mg/dL (ref 6–23)
CO2: 27 mEq/L (ref 19–32)
Calcium: 8.8 mg/dL (ref 8.4–10.5)
Chloride: 105 mEq/L (ref 96–112)
Creatinine, Ser: 1.03 mg/dL (ref 0.40–1.50)
GFR: 75.72 mL/min (ref >60.00)
Glucose, Bld: 99 mg/dL (ref 70–99)
Potassium: 3.8 mEq/L (ref 3.5–5.1)
Sodium: 139 mEq/L (ref 135–145)
Total Bilirubin: 0.5 mg/dL (ref 0.2–1.2)
Total Protein: 7.4 g/dL (ref 6.0–8.3)

## 2018-07-11 LAB — LIPID PANEL
Cholesterol: 210 mg/dL — ABNORMAL HIGH (ref 0–200)
HDL: 34.2 mg/dL — ABNORMAL LOW (ref >39.00)
NonHDL: 176.1
Total CHOL/HDL Ratio: 6
Triglycerides: 327 mg/dL — ABNORMAL HIGH (ref 0.0–149.0)
VLDL: 65.4 mg/dL — ABNORMAL HIGH (ref 0.0–40.0)

## 2018-07-11 LAB — LDL CHOLESTEROL, DIRECT: Direct LDL: 114 mg/dL

## 2018-07-11 LAB — HEMOGLOBIN A1C: Hgb A1c MFr Bld: 5.3 % (ref 4.6–6.5)

## 2018-07-11 NOTE — Progress Notes (Signed)
I acted as a scribe for Energy East CorporationSamantha Gaither Biehn, PA-C Corky MullNeurosurgeononna Orphanos, LPN  Subjective:    Alex Villegas W Gagliano is a 53 y.o. male and is here for a comprehensive physical exam.  HPI  Health Maintenance Due  Topic Date Due  . COLONOSCOPY  02/14/2016    Acute Concerns: None  Chronic Issues: Overweight -- continues to work on exercise. Does eat lots of white rice. Drinks 3 sugary beverages daily -- 1 gatorade + 2 sweetened teas. 3 ETOH drinks/week. Sister with DM.  HLD/hypertriglyceridemia -- has been on lipitor 20 mg in the past. Is open to resuming it if needed.  Health Maintenance: Immunizations -- UTD, will give Tdap today Colonoscopy -- done per pt will bring copy of report PSA -- N/A Diet -- "very latino diet" -- reports that he eats a lot of rice Caffeine intake -- iced tea x 2 daily Sleep habits -- gets about 6-8 hours daily Exercise -- soccer as able Weight -- Weight: 179 lb 8 oz (81.4 kg)  Weight history Wt Readings from Last 10 Encounters:  07/11/18 179 lb 8 oz (81.4 kg)  07/07/18 180 lb 9.6 oz (81.9 kg)  11/03/17 176 lb (79.8 kg)  06/04/17 171 lb 3.2 oz (77.7 kg)  04/15/17 188 lb (85.3 kg)  11/18/16 173 lb (78.5 kg)  10/05/16 172 lb 6.1 oz (78.2 kg)  09/27/12 175 lb (79.4 kg)  Mood -- good Tobacco use -- 3 drinks per week Alcohol use --- none  Depression screen PHQ 2/9 07/11/2018  Decreased Interest 0  Down, Depressed, Hopeless 0  PHQ - 2 Score 0     Other providers/specialists: Patient Care Team: Jarold MottoWorley, Akisha Sturgill, [place] as PCP - General (Physician Assistant)   PMHx, SurgHx, SocialHx, Medications, and Allergies were reviewed in the Visit Navigator and updated as appropriate.   Past Medical History:  Diagnosis Date  . Hyperlipidemia, mixed    Total cholesterol 210, LDL 133, triglycerides 199 HDL 37     Past Surgical History:  Procedure Laterality Date  . HIP SURGERY Right   . SHOULDER SURGERY Right      Family History  Problem Relation Age of Onset  .  Diabetes Mother   . Diabetes Sister     Social History   Tobacco Use  . Smoking status: Never Smoker  . Smokeless tobacco: Never Used  Substance Use Topics  . Alcohol use: Yes    Alcohol/week: 1.0 standard drinks    Types: 1 Cans of beer per week  . Drug use: No    Review of Systems:   Review of Systems  Constitutional: Negative for chills, fever, malaise/fatigue and weight loss.  HENT: Negative for hearing loss, sinus pain and sore throat.   Eyes: Negative for blurred vision.  Respiratory: Negative for cough and shortness of breath.   Cardiovascular: Negative for chest pain, palpitations and leg swelling.  Gastrointestinal: Negative for abdominal pain, constipation, diarrhea, heartburn, nausea and vomiting.  Genitourinary: Negative for dysuria, frequency and urgency.  Musculoskeletal: Negative for back pain, myalgias and neck pain.  Skin: Negative for itching and rash.  Neurological: Negative for dizziness, tingling, seizures, loss of consciousness and headaches.  Endo/Heme/Allergies: Negative for polydipsia.  Psychiatric/Behavioral: Negative for depression. The patient is not nervous/anxious.   All other systems reviewed and are negative.   Objective:   Vitals:   07/11/18 0817  BP: 114/70  Pulse: (!) 59  Temp: 97.9 F (36.6 C)  SpO2: 96%   Body mass index is 26.51 kg/m.  General Appearance:  Alert, cooperative, no distress, appears stated age  Head:  Normocephalic, without obvious abnormality, atraumatic  Eyes:  PERRL, conjunctiva/corneas clear, EOM's intact, fundi benign, both eyes       Ears:  Normal TM's and external ear canals, both ears  Nose: Nares normal, septum midline, mucosa normal, no drainage    or sinus tenderness  Throat: Lips, mucosa, and tongue normal; teeth and gums normal  Neck: Supple, symmetrical, trachea midline, no adenopathy; thyroid:  No enlargement/tenderness/nodules; no carotit bruit or JVD  Back:   Symmetric, no curvature, ROM normal,  no CVA tenderness  Lungs:   Clear to auscultation bilaterally, respirations unlabored  Chest wall:  No tenderness or deformity  Heart:  Regular rate and rhythm, S1 and S2 normal, no murmur, rub   or gallop  Abdomen:   Soft, non-tender, bowel sounds active all four quadrants, no masses, no organomegaly  Extremities: Extremities normal, atraumatic, no cyanosis or edema  Prostate: Not done.   Skin: Skin color, texture, turgor normal, no rashes or lesions  Lymph nodes: Cervical, supraclavicular, and axillary nodes normal  Neurologic: CNII-XII grossly intact. Normal strength, sensation and reflexes throughout    Assessment/Plan:   Kerby Noraercy was seen today for annual exam.  Diagnoses and all orders for this visit:  Routine physical examination Today patient counseled on age appropriate routine health concerns for screening and prevention, each reviewed and up to date or declined. Immunizations reviewed and up to date or declined. Labs ordered and reviewed. Risk factors for depression reviewed and negative. Hearing function and visual acuity are intact. ADLs screened and addressed as needed. Functional ability and level of safety reviewed and appropriate. Education, counseling and referrals performed based on assessed risks today. Patient provided with a copy of personalized plan for preventive services.  Need for prophylactic vaccination with combined diphtheria-tetanus-pertussis (DTP) vaccine -     Tdap vaccine greater than or equal to 7yo IM  Hyperlipidemia, mixed Agreeable to resuming statin if needed. Will recalculate ASCVD and make appropriate recommendations. -     CBC with Differential/Platelet -     Comprehensive metabolic panel -     Lipid panel  Overweight Counseling provided. I did recommend limiting the rice, and trying to find a more high fiber alternative. -     CBC with Differential/Platelet -     Comprehensive metabolic panel -     Lipid panel -     Hemoglobin A1c    Well  Adult Exam: Labs ordered: Yes. Patient counseling was done. See below for items discussed. Discussed the patient's BMI.  The BMI is not in the acceptable range; BMI management plan is completed Follow up based on lab results and recommendations.  Patient Counseling: [x]   Nutrition: Stressed importance of moderation in sodium/caffeine intake, saturated fat and cholesterol, caloric balance, sufficient intake of fresh fruits, vegetables, and fiber.  [x]   Stressed the importance of regular exercise.   []   Substance Abuse: Discussed cessation/primary prevention of tobacco, alcohol, or other drug use; driving or other dangerous activities under the influence; availability of treatment for abuse.   [x]   Injury prevention: Discussed safety belts, safety helmets, smoke detector, smoking near bedding or upholstery.   []   Sexuality: Discussed sexually transmitted diseases, partner selection, use of condoms, avoidance of unintended pregnancy  and contraceptive alternatives.   [x]   Dental health: Discussed importance of regular tooth brushing, flossing, and dental visits.  [x]   Health maintenance and immunizations reviewed. Please refer to Health maintenance section.  CMA or LPN served as scribe during this visit. History, Physical, and Plan performed by medical provider. The above documentation has been reviewed and is accurate and complete.   [name], PA-C [place]

## 2018-07-11 NOTE — Patient Instructions (Signed)
It was great to see you!  Please go to the lab for blood work.   -- Please bring your insurance form by so we can fill it out. -- If you find your colonoscopy records, please bring [place] a copy. -- We will be in touch with your labs and recommendations.   If your blood work is normal we will follow-up each year for physicals and as scheduled for chronic medical problems.  If anything is abnormal we will treat accordingly and get you in for a follow-up.  Take care,  [facility] Maintenance, Male Adopting a healthy lifestyle and getting preventive care are important in promoting health and wellness. Ask your health care provider about:  The right schedule for you to have regular tests and exams.  Things you can do on your own to prevent diseases and keep yourself healthy. What should I know about diet, weight, and exercise? Eat a healthy diet   Eat a diet that includes plenty of vegetables, fruits, low-fat dairy products, and lean protein.  Do not eat a lot of foods that are high in solid fats, added sugars, or sodium. Maintain a healthy weight Body mass index (BMI) is a measurement that can be used to identify possible weight problems. It estimates body fat based on height and weight. Your health care provider can help determine your BMI and help you achieve or maintain a healthy weight. Get regular exercise Get regular exercise. This is one of the most important things you can do for your health. Most adults should:  Exercise for at least 150 minutes each week. The exercise should increase your heart rate and make you sweat (moderate-intensity exercise).  Do strengthening exercises at least twice a week. This is in addition to the moderate-intensity exercise.  Spend less time sitting. Even light physical activity can be beneficial. Watch cholesterol and blood lipids Have your blood tested for lipids and cholesterol at 52 years of age, then have this test every 5 years.  You may need to have your cholesterol levels checked more often if:  Your lipid or cholesterol levels are high.  You are older than 52 years of age.  You are at high risk for heart disease. What should I know about cancer screening? Many types of cancers can be detected early and may often be prevented. Depending on your health history and family history, you may need to have cancer screening at various ages. This may include screening for:  Colorectal cancer.  Prostate cancer.  Skin cancer.  Lung cancer. What should I know about heart disease, diabetes, and high blood pressure? Blood pressure and heart disease  High blood pressure causes heart disease and increases the risk of stroke. This is more likely to develop in people who have high blood pressure readings, are of African descent, or are overweight.  Talk with your health care provider about your target blood pressure readings.  Have your blood pressure checked: ? Every 3-5 years if you are 36-82 years of age. ? Every year if you are 80 years old or older.  If you are between the ages of 73 and 31 and are a current or former smoker, ask your health care provider if you should have a one-time screening for abdominal aortic aneurysm (AAA). Diabetes Have regular diabetes screenings. This checks your fasting blood sugar level. Have the screening done:  Once every three years after age 74 if you are at a normal weight and have a low  risk for diabetes.  More often and at a younger age if you are overweight or have a high risk for diabetes. What should I know about preventing infection? Hepatitis B If you have a higher risk for hepatitis B, you should be screened for this virus. Talk with your health care provider to find out if you are at risk for hepatitis B infection. Hepatitis C Blood testing is recommended for:  Everyone born from [birth year] through 1964.  Anyone with known risk factors for hepatitis C. Sexually  transmitted infections (STIs)  You should be screened each year for STIs, including gonorrhea and chlamydia, if: ? You are sexually active and are younger than 52 years of age. ? You are older than 52 years of age and your health care provider tells you that you are at risk for this type of infection. ? Your sexual activity has changed since you were last screened, and you are at increased risk for chlamydia or gonorrhea. Ask your health care provider if you are at risk.  Ask your health care provider about whether you are at high risk for HIV. Your health care provider may recommend a prescription medicine to help prevent HIV infection. If you choose to take medicine to prevent HIV, you should first get tested for HIV. You should then be tested every 3 months for as long as you are taking the medicine. Follow these instructions at home: Lifestyle  Do not use any products that contain nicotine or tobacco, such as cigarettes, e-cigarettes, and chewing tobacco. If you need help quitting, ask your health care provider.  Do not use street drugs.  Do not share needles.  Ask your health care provider for help if you need support or information about quitting drugs. Alcohol use  Do not drink alcohol if your health care provider tells you not to drink.  If you drink alcohol: ? Limit how much you have to 0-2 drinks a day. ? Be aware of how much alcohol is in your drink. In the [place] one drink equals one 12 oz bottle of beer (355 mL), one 5 oz glass of wine (148 mL), or one 1 oz glass of hard liquor (44 mL). General instructions  Schedule regular health, dental, and eye exams.  Stay current with your vaccines.  Tell your health care provider if: ? You often feel depressed. ? You have ever been abused or do not feel safe at home. Summary  Adopting a healthy lifestyle and getting preventive care are important in promoting health and wellness.  Follow your health care provider's  instructions about healthy diet, exercising, and getting tested or screened for diseases.  Follow your health care provider's instructions on monitoring your cholesterol and blood pressure. This information is not intended to replace advice given to you by your health care provider. Make sure you discuss any questions you have with your health care provider. Document Released: 06/20/2007 Document Revised: 12/15/2017 Document Reviewed: 12/15/2017 Elsevier Patient Education  2020 [employer].

## 2018-07-12 NOTE — Progress Notes (Signed)
[facility] [name], [place] [address] Phone: [phone] Subjective:   Fontaine No, am serving as a scribe for Dr. Hulan Saas.  I'm seeing this patient by the request  of:  Alex Villegas, [place]   CC: Finger pain  GDJ:[id number]  Alex Villegas is a 53 y.o. male coming in with complaint of right hand pain, 3rd finger. One year ago noticed that he would wake up with stiff fingers. Symptoms have progressed from stiffness to finger getting stuck when he closes his hand. Pain is also radiating into the wrist. No injury to area and has not been using his hands more than usual.      Past Medical History:  Diagnosis Date  . Hyperlipidemia, mixed    Total cholesterol 210, LDL 133, triglycerides 199 HDL 37   Past Surgical History:  Procedure Laterality Date  . HIP SURGERY Right   . SHOULDER SURGERY Right    Social History   Socioeconomic History  . Marital status: Single    Spouse name: Not on file  . Number of children: Not on file  . Years of education: Not on file  . Highest education level: Not on file  Occupational History  . Not on file  Social Needs  . Financial resource strain: Not on file  . Food insecurity    Worry: Not on file    Inability: Not on file  . Transportation needs    Medical: Not on file    Non-medical: Not on file  Tobacco Use  . Smoking status: Never Smoker  . Smokeless tobacco: Never Used  Substance and Sexual Activity  . Alcohol use: Yes    Alcohol/week: 1.0 standard drinks    Types: 1 Cans of beer per week  . Drug use: No  . Sexual activity: Never    Birth control/protection: Abstinence  Lifestyle  . Physical activity    Days per week: Not on file    Minutes per session: Not on file  . Stress: Not on file  Relationships  . Social Herbalist on phone: Not on file    Gets together: Not on file    Attends religious service: Not on file    Active member of club or organization:  Not on file    Attends meetings of clubs or organizations: Not on file    Relationship status: Not on file  Other Topics Concern  . Not on file  Social History Narrative   Single, 2 children   Works at [employer], Engineer, technical sales   Diet -- "[employer]", eats all food groups, drinks mostly sweet tea and orange juice    Caffeine intake -- 1 coffee daily    Exercise -- soccer a few days a week for exercise   No Known Allergies Family History  Problem Relation Age of Onset  . Diabetes Mother   . Diabetes Sister        Current Outpatient Medications (Analgesics):  .  meloxicam (MOBIC) 15 MG tablet, Take 1 tablet (15 mg total) by mouth daily.   Current Outpatient Medications (Other):  Marland Kitchen  Diclofenac Sodium (PENNSAID) 2 % SOLN, Place 2 g onto the skin 2 (two) times daily.    Past medical history, social, surgical and family history all reviewed in electronic medical record.  No pertanent information unless stated regarding to the chief complaint.   Review of Systems:  No headache, visual changes, nausea, vomiting, diarrhea,  constipation, dizziness, abdominal pain, skin rash, fevers, chills, night sweats, weight loss, swollen lymph nodes, body aches, joint swelling, muscle aches, chest pain, shortness of breath, mood changes.   Objective  Blood pressure 108/70, pulse (!) 58, height 5\' 9"  (1.753 m), weight 180 lb (81.6 kg), SpO2 97 %.    General: No apparent distress alert and oriented x3 mood and affect normal, dressed appropriately.  HEENT: Pupils equal, extraocular movements intact  Respiratory: Patient's speak in full sentences and does not appear short of breath  Cardiovascular: No lower extremity edema, non tender, no erythema  Skin: Warm dry intact with no signs of infection or rash on extremities or on axial skeleton.  Abdomen: Soft nontender  Neuro: Cranial nerves II through XII are intact, neurovascularly intact in all extremities with 2+ DTRs and 2+ pulses.   Lymph: No lymphadenopathy of posterior or anterior cervical chain or axillae bilaterally.  Gait normal with good balance and coordination.  MSK:  Non tender with full range of motion and good stability and symmetric strength and tone of shoulders, elbows, wrist, hip, knee and ankles bilaterally.  Right hand exam shows the patient does have triggering of the third finger.  Patient does have a nodule noted at the A2 pulley.  Tender to palpation in this area.  No angulation of the fingers.  Good grip strength noted.  Limited musculoskeletal ultrasound was performed and interpreted by Judi SaaZachary M Kaz Auld  Limited ultrasound of patient's flexor tendon sheath shows the patient does have a trigger nodule and triggering is noted with dynamic testing. Impression: Triggering finger third finger right hand   Impression and Recommendations:     This case required medical decision making of moderate complexity. The above documentation has been reviewed and is accurate and complete Judi SaaZachary M Santhiago Collingsworth, DO       Note: This dictation was prepared with Dragon dictation along with smaller phrase technology. Any transcriptional errors that result from this process are unintentional.

## 2018-07-13 ENCOUNTER — Ambulatory Visit: Payer: Self-pay

## 2018-07-13 ENCOUNTER — Ambulatory Visit: Payer: Commercial Managed Care - PPO | Admitting: Family Medicine

## 2018-07-13 ENCOUNTER — Other Ambulatory Visit: Payer: Self-pay

## 2018-07-13 ENCOUNTER — Encounter: Payer: Self-pay | Admitting: Family Medicine

## 2018-07-13 VITALS — BP 108/70 | HR 58 | Ht 69.0 in | Wt 180.0 lb

## 2018-07-13 DIAGNOSIS — M79644 Pain in right finger(s): Secondary | ICD-10-CM

## 2018-07-13 DIAGNOSIS — M65331 Trigger finger, right middle finger: Secondary | ICD-10-CM | POA: Insufficient documentation

## 2018-07-13 MED ORDER — PENNSAID 2 % TD SOLN: 2.0000 g | Freq: Two times a day (BID) | TRANSDERMAL | 3 refills | Status: DC

## 2018-07-13 NOTE — Assessment & Plan Note (Signed)
Patient is significant amount of repetitive motion.  Likely contributing to some of the discomfort and pain.  We discussed with patient about different treatment options and patient elected to go with more bracing, topical anti-inflammatories, icing regimen.  Discussed with patient about the possibility of injection with patient today.  We will follow-up with me again in 3 to 6 weeks

## 2018-07-13 NOTE — Patient Instructions (Addendum)
Good to see you. Brace at night Heat and massage at night Tape wrist with a lot of activity as we have shown you  See me again in 3-6 weeks

## 2018-07-21 ENCOUNTER — Encounter: Payer: Self-pay | Admitting: Physician Assistant

## 2018-07-28 ENCOUNTER — Other Ambulatory Visit: Payer: Self-pay

## 2018-07-28 DIAGNOSIS — Z20822 Contact with and (suspected) exposure to covid-19: Secondary | ICD-10-CM

## 2018-07-28 NOTE — Progress Notes (Unsigned)
[id number] 

## 2018-07-29 ENCOUNTER — Other Ambulatory Visit: Payer: Self-pay | Admitting: Physician Assistant

## 2018-07-30 LAB — NOVEL CORONAVIRUS, NAA: SARS-CoV-2, NAA: NOT DETECTED

## 2018-08-02 ENCOUNTER — Encounter: Payer: Self-pay | Admitting: Physician Assistant

## 2018-08-02 ENCOUNTER — Ambulatory Visit (INDEPENDENT_AMBULATORY_CARE_PROVIDER_SITE_OTHER): Payer: Commercial Managed Care - PPO | Admitting: Physician Assistant

## 2018-08-02 ENCOUNTER — Telehealth: Payer: Self-pay | Admitting: Physician Assistant

## 2018-08-02 DIAGNOSIS — E782 Mixed hyperlipidemia: Secondary | ICD-10-CM

## 2018-08-02 DIAGNOSIS — L237 Allergic contact dermatitis due to plants, except food: Secondary | ICD-10-CM | POA: Diagnosis not present

## 2018-08-02 MED ORDER — PREDNISONE 10 MG PO TABS: ORAL_TABLET | ORAL | 0 refills | Status: AC

## 2018-08-02 MED ORDER — ATORVASTATIN CALCIUM 20 MG PO TABS: 20.0000 mg | ORAL_TABLET | Freq: Every day | ORAL | 0 refills | Status: DC

## 2018-08-02 NOTE — Telephone Encounter (Signed)
See note

## 2018-08-02 NOTE — Progress Notes (Signed)
Virtual Visit via Video   I connected with Alex Villegas on 08/02/18 at 11:40 AM EDT by a video enabled telemedicine application and verified that I am speaking with the correct person using two identifiers. Location patient: Home Location provider: [facility], Office Persons participating in the virtual visit: Alex Villegas, Sibley Rolison PA-C.   I discussed the limitations of evaluation and management by telemedicine and the availability of in person appointments. The patient expressed understanding and agreed to proceed.  I acted as a Neurosurgeonscribe for Energy East CorporationSamantha Trinitee Horgan, Avon ProductsPA-C Donna Orphanos, LPN  Subjective:   HPI:   Poison Ivy Pt c/o poison x 2 weeks and is spreading all over body, itching. He has tried cortisone ointment no relief. Denies: open areas, fever, chills, use of antihistamine, facial/tongue swelling  HLD He is interested in resuming his lipitor 20 mg. Labs were recently checked and he was recommended to restart this. The 10-year ASCVD risk score Denman George(Goff DC Montez HagemanJr., et al., 2013) is: 10%   Values used to calculate the score:     Age: [age] years     Sex: Male     Is Non-Hispanic African American: No     Diabetic: No     Tobacco smoker: Yes     Systolic Blood Pressure: 108 mmHg     Is BP treated: No     HDL Cholesterol: 34.2 mg/dL     Total Cholesterol: 210 mg/dL   ROS: See pertinent positives and negatives per HPI.  Patient Active Problem List   Diagnosis Date Noted  . Acquired trigger finger of right middle finger 07/13/2018  . Chest pain with low risk for cardiac etiology 11/18/2016  . Hyperlipidemia, mixed   . Primary osteoarthritis of right hip 10/28/2015    Social History   Tobacco Use  . Smoking status: Never Smoker  . Smokeless tobacco: Never Used  Substance Use Topics  . Alcohol use: Yes    Alcohol/week: 1.0 standard drinks    Types: 1 Cans of beer per week    Current Outpatient Medications:  .  atorvastatin (LIPITOR) 20 MG tablet, Take 1 tablet  (20 mg total) by mouth daily., Disp: 90 tablet, Rfl: 0 .  predniSONE (DELTASONE) 10 MG tablet, Take two tablets daily x 1 week, then 1 tablet daily x 1 week., Disp: 21 tablet, Rfl: 0  No Known Allergies  Objective:   VITALS: Per patient if applicable, see vitals. GENERAL: Alert, appears well and in no acute distress. HEENT: Atraumatic, conjunctiva clear, no obvious abnormalities on inspection of external nose and ears. NECK: Normal movements of the head and neck. CARDIOPULMONARY: No increased WOB. Speaking in clear sentences. I:E ratio WNL.  MS: Moves all visible extremities without noticeable abnormality. PSYCH: Pleasant and cooperative, well-groomed. Speech normal rate and rhythm. Affect is appropriate. Insight and judgement are appropriate. Attention is focused, linear, and appropriate.  NEURO: CN grossly intact. Oriented as arrived to appointment on time with no prompting. Moves both UE equally.  SKIN: scattered papular areas on bilateral arms and on abdomen, some areas with raised erythematous linear lesions  Assessment and Plan:   Kerby Noraercy was seen today for poison ivy.  Diagnoses and all orders for this visit:  Hyperlipidemia, mixed Resume 20 mg lipitor. Follow-up in 3 months.  Plant allergic contact dermatitis Will trial oral prednisone. Also consider taking oral antihistamine. Follow-up if symptoms persist or do not improve with treatment.  Other orders -     predniSONE (DELTASONE) 10 MG tablet;  Take two tablets daily x 1 week, then 1 tablet daily x 1 week. -     atorvastatin (LIPITOR) 20 MG tablet; Take 1 tablet (20 mg total) by mouth daily.    . Reviewed expectations re: course of current medical issues. . Discussed self-management of symptoms. . Outlined signs and symptoms indicating need for more acute intervention. . Patient verbalized understanding and all questions were answered. Marland Kitchen Health Maintenance issues including appropriate healthy diet, exercise, and smoking  avoidance were discussed with patient. . See orders for this visit as documented in the electronic medical record.  I discussed the assessment and treatment plan with the patient. The patient was provided an opportunity to ask questions and all were answered. The patient agreed with the plan and demonstrated an understanding of the instructions.   The patient was advised to call back or seek an in-person evaluation if the symptoms worsen or if the condition fails to improve as anticipated.   CMA or LPN served as scribe during this visit. History, Physical, and Plan performed by medical provider. The above documentation has been reviewed and is accurate and complete.   [employer], [place] 08/02/2018

## 2018-08-02 NOTE — Telephone Encounter (Signed)
Pt states that he forgot to ask at his appointment:  Pt wants to know if poison ivy is contagious from him to another person.

## 2018-08-02 NOTE — Telephone Encounter (Signed)
Spoke to pt told him poison ivy is not contagious from person to person unless they touch it, keep area covered. Pt verbalized understanding.

## 2018-08-08 ENCOUNTER — Encounter: Payer: Self-pay | Admitting: Family Medicine

## 2018-08-08 ENCOUNTER — Other Ambulatory Visit: Payer: Self-pay

## 2018-08-08 ENCOUNTER — Ambulatory Visit: Payer: Self-pay

## 2018-08-08 ENCOUNTER — Ambulatory Visit (INDEPENDENT_AMBULATORY_CARE_PROVIDER_SITE_OTHER): Payer: Commercial Managed Care - PPO | Admitting: Family Medicine

## 2018-08-08 VITALS — BP 110/70 | HR 66 | Ht 69.0 in | Wt 180.0 lb

## 2018-08-08 DIAGNOSIS — M79641 Pain in right hand: Secondary | ICD-10-CM

## 2018-08-08 DIAGNOSIS — M65331 Trigger finger, right middle finger: Secondary | ICD-10-CM

## 2018-08-08 DIAGNOSIS — S8261XA Displaced fracture of lateral malleolus of right fibula, initial encounter for closed fracture: Secondary | ICD-10-CM | POA: Insufficient documentation

## 2018-08-08 DIAGNOSIS — S8264XA Nondisplaced fracture of lateral malleolus of right fibula, initial encounter for closed fracture: Secondary | ICD-10-CM

## 2018-08-08 NOTE — Progress Notes (Signed)
Tawana ScaleZach Woodfin Kiss D.O. [facility] [address] [place], [place] [address] Phone: [phone] Subjective:   Alex Villegas, Valerie Wolf, am serving as a scribe for Dr. Antoine PrimasZachary Jatavis Malek.  I'm seeing this patient by the request  of:    CC: Finger pain follow-up and new ankle pain  WGN:[id number]:Subjective   07/13/2018 Patient is significant amount of repetitive motion.  Likely contributing to some of the discomfort and pain.  We discussed with patient about different treatment options and patient elected to go with more bracing, topical anti-inflammatories, icing regimen.  Discussed with patient about the possibility of injection with patient today.  We will follow-up with me again in 3 to 6 weeks  Update 08/08/2018 Alex Villegas is a 53 y.o. male coming in with complaint of right hand middle finger. Is still the same as last visit. Did brace finger at night. Did not take any of prescribed medication.  Patient also notes that he sprained his right ankle. Has some residual soreness.  Patient injured it initially with rolling his ankle that had significant swelling and difficulty to ambulate.  Did have x-rays done.  X-rays independently visualized by me and are unremarkable.  Patient took 5 weeks off from playing any soccer but went back this week.  Found it difficult.  Denies any numbness, insulin even with walking long distances has some discomfort and pain.     Past Medical History:  Diagnosis Date  . Hyperlipidemia, mixed    Total cholesterol 210, LDL 133, triglycerides 199 HDL 37   Past Surgical History:  Procedure Laterality Date  . HIP SURGERY Right   . SHOULDER SURGERY Right    Social History   Socioeconomic History  . Marital status: Single    Spouse name: Not on file  . Number of children: Not on file  . Years of education: Not on file  . Highest education level: Not on file  Occupational History  . Not on file  Social Needs  . Financial resource strain: Not on file  . Food insecurity    Worry: Not on file    Inability: Not on file  . Transportation needs    Medical: Not on file    Non-medical: Not on file  Tobacco Use  . Smoking status: Never Smoker  . Smokeless tobacco: Never Used  Substance and Sexual Activity  . Alcohol use: Yes    Alcohol/week: 1.0 standard drinks    Types: 1 Cans of beer per week  . Drug use: No  . Sexual activity: Never    Birth control/protection: Abstinence  Lifestyle  . Physical activity    Days per week: Not on file    Minutes per session: Not on file  . Stress: Not on file  Relationships  . Social Musicianconnections    Talks on phone: Not on file    Gets together: Not on file    Attends religious service: Not on file    Active member of club or organization: Not on file    Attends meetings of clubs or organizations: Not on file    Relationship status: Not on file  Other Topics Concern  . Not on file  Social History Narrative   Single, 2 children   Works at [employer], ArtistAviation Mechanic   Diet -- "[employer]", eats all food groups, drinks mostly sweet tea and orange juice    Caffeine intake -- 1 coffee daily    Exercise -- soccer a few days a week for  exercise   No Known Allergies Family History  Problem Relation Age of Onset  . Diabetes Mother   . Diabetes Sister     Current Outpatient Medications (Endocrine & Metabolic):  .  predniSONE (DELTASONE) 10 MG tablet, Take two tablets daily x 1 week, then 1 tablet daily x 1 week.  Current Outpatient Medications (Cardiovascular):  .  atorvastatin (LIPITOR) 20 MG tablet, Take 1 tablet (20 mg total) by mouth daily.        Past medical history, social, surgical and family history all reviewed in electronic medical record.  No pertanent information unless stated regarding to the chief complaint.   Review of Systems:  No headache, visual changes, nausea, vomiting, diarrhea, constipation, dizziness, abdominal pain, skin rash, fevers, chills, night sweats, weight loss, swollen  lymph nodes, body aches, joint swelling,  chest pain, shortness of breath, mood changes.  Positive muscle aches  Objective  Blood pressure 110/70, pulse 66, height 5\' 9"  (1.753 m), weight 180 lb (81.6 kg), SpO2 97 %.    General: No apparent distress alert and oriented x3 mood and affect normal, dressed appropriately.  HEENT: Pupils equal, extraocular movements intact  Respiratory: Patient's speak in full sentences and does not appear short of breath  Cardiovascular: No lower extremity edema, non tender, no erythema  Skin: Warm dry intact with no signs of infection or rash on extremities or on axial skeleton.  Abdomen: Soft nontender  Neuro: Cranial nerves II through XII are intact, neurovascularly intact in all extremities with 2+ DTRs and 2+ pulses.  Lymph: No lymphadenopathy of posterior or anterior cervical chain or axillae bilaterally.  Gait normal with good balance and coordination.  MSK:  Non tender with full range of motion and good stability and symmetric strength and tone of shoulders, elbows, wrist, hip, knee bilaterally.   Right hand exam shows the patient does have a trigger nodule at the A2 pulley of the right ring finger.  Severely tender to palpation in the area.  Patient does have triggering noted with extension.  Otherwise hand exam is unremarkable  Right hand exam shows the patient does have some tender to palpation over the lateral malleolus area.  Patient does have negative anterior drawer's test.  Near full range of motion.  Negative Thompson test.  Full range of motion of the knee above.  Limited musculoskeletal ultrasound was performed and interpreted by Lyndal Pulley  Limited ultrasound of patient's right ankle shows that patient does have an cortical defect noted of the lateral malleolus.  Seems to be in the reabsorption stage of the healing aspect though.  Patient has no joint effusion noted.  ATFL does appear intact  Procedure: Real-time Ultrasound Guided Injection  of right trigger nodule third finger flexor tendon sheath Device: GE Logiq Q7 Ultrasound guided injection is preferred based studies that show increased duration, increased effect, greater accuracy, decreased procedural pain, increased response rate, and decreased cost with ultrasound guided versus blind injection.  Verbal informed consent obtained.  Time-out conducted.  Noted no overlying erythema, induration, or other signs of local infection.  Skin prepped in a sterile fashion.  Local anesthesia: Topical Ethyl chloride.  With sterile technique and under real time ultrasound guidance: With a 25-gauge half inch needle injected with 0.5 cc of 0.5% Marcaine and 0.5 cc of Kenalog 40 mg/mL Completed without difficulty  Pain immediately resolved suggesting accurate placement of the medication.  Advised to call if fevers/chills, erythema, induration, drainage, or persistent bleeding.  Images permanently stored and  available for review in the ultrasound unit.  Impression: Technically successful ultrasound guided injection.     Impression and Recommendations:     This case required medical decision making of moderate complexity. The above documentation has been reviewed and is accurate and complete Judi SaaZachary M Aum Caggiano, DO       Note: This dictation was prepared with Dragon dictation along with smaller phrase technology. Any transcriptional errors that result from this process are unintentional.

## 2018-08-08 NOTE — Assessment & Plan Note (Signed)
Small but seems to be doing relatively well.  ATFL does appear to be intact.  Patient likely will do well with conservative therapy, Aircast given, home exercises to work with athletic trainer, patient will increase activity slowly.  Follow-up again in 4 to 8 weeks

## 2018-08-08 NOTE — Assessment & Plan Note (Signed)
Patient given injection and tolerated the procedure well.  We discussed icing regimen and home exercise, we discussed which activities to do which wants to avoid.  Patient will increase activity slowly over the course the next several days.  Follow-up again with me 4 to 8 weeks.  Bracing at night encouraged

## 2018-08-08 NOTE — Patient Instructions (Addendum)
Exercises 3x a week Injected finger Brace at night Aircast with activity See me again in 4-5 weeks

## 2018-08-09 ENCOUNTER — Ambulatory Visit: Payer: Commercial Managed Care - PPO | Admitting: Family Medicine

## 2018-09-09 ENCOUNTER — Ambulatory Visit: Payer: Commercial Managed Care - PPO | Admitting: Family Medicine

## 2018-09-09 ENCOUNTER — Other Ambulatory Visit: Payer: Self-pay

## 2018-09-09 ENCOUNTER — Encounter: Payer: Self-pay | Admitting: Family Medicine

## 2018-09-09 DIAGNOSIS — S8264XD Nondisplaced fracture of lateral malleolus of right fibula, subsequent encounter for closed fracture with routine healing: Secondary | ICD-10-CM | POA: Diagnosis not present

## 2018-09-09 DIAGNOSIS — M65331 Trigger finger, right middle finger: Secondary | ICD-10-CM

## 2018-09-09 NOTE — Progress Notes (Signed)
Tawana ScaleZach Lorene Samaan D.O. [facility] [address] [place], [place] [address] Phone: [phone] Subjective:   I Alex NighKana Villegas am serving as a Neurosurgeonscribe for Dr. Antoine PrimasZachary Mechel Haggard.   CC: Right hand and ankle pain follow-up  OZH:YQMVHQIONGHPI:Subjective  08/08/2018 Small but seems to be doing relatively well.  ATFL does appear to be intact.  Patient likely will do well with conservative therapy, Aircast given, home exercises to work with athletic trainer, patient will increase activity slowly.  Follow-up again in 4 to 8 weeks  Patient given injection and tolerated the procedure well.  We discussed icing regimen and home exercise, we discussed which activities to do which wants to avoid.  Patient will increase activity slowly over the course the next several days.  Follow-up again with me 4 to 8 weeks.  Bracing at night encouraged  Update 09/09/2018 Alex Villegas is a 53 y.o. male coming in with complaint of right hand and right ankle pain. Patient states that he is doing better.  Right hand significantly better after the injection.  Still some mild triggering but nothing that is concerning at the moment.  Nothing that is hurting.  Right ankle has made improvement as well.  Has been wearing the brace daily, has even played soccer in it.  States that it feels 85% better.     Past Medical History:  Diagnosis Date  . Hyperlipidemia, mixed    Total cholesterol 210, LDL 133, triglycerides 199 HDL 37   Past Surgical History:  Procedure Laterality Date  . HIP SURGERY Right   . SHOULDER SURGERY Right    Social History   Socioeconomic History  . Marital status: Single    Spouse name: Not on file  . Number of children: Not on file  . Years of education: Not on file  . Highest education level: Not on file  Occupational History  . Not on file  Social Needs  . Financial resource strain: Not on file  . Food insecurity    Worry: Not on file    Inability: Not on file  . Transportation needs    Medical: Not on  file    Non-medical: Not on file  Tobacco Use  . Smoking status: Never Smoker  . Smokeless tobacco: Never Used  Substance and Sexual Activity  . Alcohol use: Yes    Alcohol/week: 1.0 standard drinks    Types: 1 Cans of beer per week  . Drug use: No  . Sexual activity: Never    Birth control/protection: Abstinence  Lifestyle  . Physical activity    Days per week: Not on file    Minutes per session: Not on file  . Stress: Not on file  Relationships  . Social Musicianconnections    Talks on phone: Not on file    Gets together: Not on file    Attends religious service: Not on file    Active member of club or organization: Not on file    Attends meetings of clubs or organizations: Not on file    Relationship status: Not on file  Other Topics Concern  . Not on file  Social History Narrative   Single, 2 children   Works at [employer], ArtistAviation Mechanic   Diet -- "[employer] Mutual[employer]uvian foods", eats all food groups, drinks mostly sweet tea and orange juice    Caffeine intake -- 1 coffee daily    Exercise -- soccer a few days a week for exercise   No Known Allergies Family History  Problem Relation Age of Onset  . Diabetes Mother   . Diabetes Sister     Current Outpatient Medications (Endocrine & Metabolic):  .  predniSONE (DELTASONE) 10 MG tablet, Take two tablets daily x 1 week, then 1 tablet daily x 1 week.  Current Outpatient Medications (Cardiovascular):  .  atorvastatin (LIPITOR) 20 MG tablet, Take 1 tablet (20 mg total) by mouth daily.        Past medical history, social, surgical and family history all reviewed in electronic medical record.  No pertanent information unless stated regarding to the chief complaint.   Review of Systems:  No headache, visual changes, nausea, vomiting, diarrhea, constipation, dizziness, abdominal pain, skin rash, fevers, chills, night sweats, weight loss, swollen lymph nodes, body aches, joint swelling, muscle aches, chest pain, shortness of breath, mood  changes.   Objective  Blood pressure 106/70, pulse 65, height 5\' 9"  (1.753 m), weight 182 lb (82.6 kg), SpO2 96 %.    General: No apparent distress alert and oriented x3 mood and affect normal, dressed appropriately.  HEENT: Pupils equal, extraocular movements intact  Respiratory: Patient's speak in full sentences and does not appear short of breath  Cardiovascular: No lower extremity edema, non tender, no erythema  Skin: Warm dry intact with no signs of infection or rash on extremities or on axial skeleton.  Abdomen: Soft nontender  Neuro: Cranial nerves II through XII are intact, neurovascularly intact in all extremities with 2+ DTRs and 2+ pulses.  Lymph: No lymphadenopathy of posterior or anterior cervical chain or axillae bilaterally.  Gait normal with good balance and coordination.  MSK:  Non tender with full range of motion and good stability and symmetric strength and tone of shoulders, elbows, wrist, hip, knee and bilaterally.  Hand exam unremarkable.  Patient does not have any triggering noted today.  Right ankle has some very mild pain over the ATFL but patient does have full range of motion.  Improvement noted.  Neurovascularly intact distally.  Achilles tendon nontender   Impression and Recommendations:      The above documentation has been reviewed and is accurate and complete Lyndal Pulley, DO       Note: This dictation was prepared with Dragon dictation along with smaller phrase technology. Any transcriptional errors that result from this process are unintentional.

## 2018-09-09 NOTE — Patient Instructions (Signed)
Continue vitamins Keep doing exercises See me again in 6 weks

## 2018-09-10 ENCOUNTER — Encounter: Payer: Self-pay | Admitting: Family Medicine

## 2018-09-10 NOTE — Assessment & Plan Note (Signed)
Patient previously did have more of the lateral malleolus fracture but seems to be doing much better.  Discussed with patient about bracing, icing, home exercises.  Follow-up again 6 weeks if not completely resolved

## 2018-09-10 NOTE — Assessment & Plan Note (Signed)
Patient has made improvement.  We will continue to monitor.  Repeat injections when necessary.  Patient does do a lot of repetitive activity as in the clinic so we will need to monitor

## 2018-10-24 ENCOUNTER — Other Ambulatory Visit: Payer: Self-pay | Admitting: Physician Assistant

## 2018-10-24 ENCOUNTER — Ambulatory Visit: Payer: Commercial Managed Care - PPO | Admitting: Family Medicine

## 2019-05-23 ENCOUNTER — Other Ambulatory Visit: Payer: Self-pay | Admitting: Family Medicine

## 2019-05-24 ENCOUNTER — Encounter: Payer: Commercial Managed Care - PPO | Admitting: Physician Assistant

## 2019-05-24 LAB — HEPATITIS B SURFACE ANTIBODY,QUALITATIVE: Hep B Surface Ab, Qual: REACTIVE

## 2019-07-12 ENCOUNTER — Encounter: Payer: Self-pay | Admitting: Physician Assistant

## 2019-07-12 ENCOUNTER — Encounter: Payer: Commercial Managed Care - PPO | Admitting: Physician Assistant

## 2019-07-12 DIAGNOSIS — Z0289 Encounter for other administrative examinations: Secondary | ICD-10-CM

## 2019-07-12 NOTE — Progress Notes (Deleted)
I acted as a Neurosurgeon for [employer], PA-C [place], [place]   Subjective:    Alex Villegas is a 54 y.o. male and is here for a comprehensive physical exam.   HPI   Health Maintenance Due  Topic Date Due  . Hepatitis C Screening  Never done  . COVID-19 Vaccine (1) Never done    Acute Concerns:   Chronic Issues: Hyperlipidemia- Currently taking Lipitor 20 MG daily.  Health Maintenance: Immunizations -- UTD Colonoscopy -- UTD- done on 05/04/2016 PSA -- 06/04/2017 Diet -- *** Caffeine intake -- *** Sleep habits -- *** Exercise -- *** Weight --    Recent weight history Wt Readings from Last 10 Encounters:  09/09/18 182 lb (82.6 kg)  08/08/18 180 lb (81.6 kg)  07/13/18 180 lb (81.6 kg)  07/11/18 179 lb 8 oz (81.4 kg)  07/07/18 180 lb 9.6 oz (81.9 kg)  11/03/17 176 lb (79.8 kg)  06/04/17 171 lb 3.2 oz (77.7 kg)  04/15/17 188 lb (85.3 kg)  11/18/16 173 lb (78.5 kg)  10/05/16 172 lb 6.1 oz (78.2 kg)   There is no height or weight on file to calculate BMI. Mood -- *** Alcohol use -- *** Tobacco use -- ***  Depression screen PHQ 2/9 07/11/2018  Decreased Interest 0  Down, Depressed, Hopeless 0  PHQ - 2 Score 0    Other providers/specialists: Patient Care Team: Jarold Motto, [place] as PCP - General (Physician Assistant)    Prostate Symptoms Questionnaire: 1. Have you had the sensation of not emptying your bladder completely after you finished urinating? {yes no:314532::"no"} 2. Have you had to urinate again less than two hours after you finished urinating? {yes no:314532::"no"} 3. Have you found you stopped and started again several times when you urinated? {yes no:314532::"no"} 4. Have you found it difficult to postpone urination? {yes no:314532::"no"} 5. Have you had a weak urinary stream? {yes no:314532::"no"} 6. Have you had to push or strain to begin urination? {yes no:314532::"no"} 7. How many times did you most typically get up to urinate from the  time you went to bed at night until the time you got up in the morning? ***  PMHx, SurgHx, SocialHx, Medications, and Allergies were reviewed in the Visit Navigator and updated as appropriate.   Past Medical History:  Diagnosis Date  . Hyperlipidemia, mixed    Total cholesterol 210, LDL 133, triglycerides 199 HDL 37    Past Surgical History:  Procedure Laterality Date  . HIP SURGERY Right   . SHOULDER SURGERY Right     Family History  Problem Relation Age of Onset  . Diabetes Mother   . Diabetes Sister     Social History   Tobacco Use  . Smoking status: Never Smoker  . Smokeless tobacco: Never Used  Vaping Use  . Vaping Use: Never used  Substance Use Topics  . Alcohol use: Yes    Alcohol/week: 1.0 standard drink    Types: 1 Cans of beer per week  . Drug use: No    Review of Systems:   ROS  Objective:   There were no vitals filed for this visit. There is no height or weight on file to calculate BMI.  General  Alert, cooperative, no distress, appears stated age  Head:  Normocephalic, without obvious abnormality, atraumatic  Eyes:  PERRL, conjunctiva/corneas clear, EOM's intact, fundi benign, both eyes       Ears:  Normal TM's and external ear canals, both ears  Nose: Nares  normal, septum midline, mucosa normal, no drainage or sinus tenderness  Throat: Lips, mucosa, and tongue normal; teeth and gums normal  Neck: Supple, symmetrical, trachea midline, no adenopathy;     thyroid:  No enlargement/tenderness/nodules; no carotid bruit or JVD  Back:   Symmetric, no curvature, ROM normal, no CVA tenderness  Lungs:   Clear to auscultation bilaterally, respirations unlabored  Chest wall:  No tenderness or deformity  Heart:  Regular rate and rhythm, S1 and S2 normal, no murmur, rub or gallop  Abdomen:   Soft, non-tender, bowel sounds active all four quadrants, no masses, no organomegaly  Extremities: Extremities normal, atraumatic, no cyanosis or edema  Prostate : ***     Skin: Skin color, texture, turgor normal, no rashes or lesions  Lymph nodes: Cervical, supraclavicular, and axillary nodes normal  Neurologic: CNII-XII grossly intact. Normal strength, sensation and reflexes throughout   Results for orders placed or performed in visit on 05/23/19  Hepatitis B surface antibody,qualitative  Result Value Ref Range   Hep B Surface Ab, Qual Reactive     AssessmentPlan:   There are no diagnoses linked to this encounter.   Well Adult Exam: Labs ordered: {Yes/No:18319::"Yes"}. Patient counseling was done. See below for items discussed. Discussed the patient's BMI.  The BMI {BMI plan (MU NQF measure 421):19504} Follow up {follow-up interval:13814}. Colon Cancer: ***  Patient Counseling: [x]   Nutrition: Stressed importance of moderation in sodium/caffeine intake, saturated fat and cholesterol, caloric balance, sufficient intake of fresh fruits, vegetables, and fiber  [x]   Stressed the importance of regular exercise.   []   Substance Abuse: Discussed cessation/primary prevention of tobacco, alcohol, or other drug use; driving or other dangerous activities under the influence; availability of treatment for abuse.   [x]   Injury prevention: Discussed safety belts, safety helmets, smoke detector, smoking near bedding or upholstery.   []   Sexuality: Discussed sexually transmitted diseases, partner selection, use of condoms, avoidance of unintended pregnancy  and contraceptive alternatives.   [x]   Dental health: Discussed importance of regular tooth brushing, flossing, and dental visits.  [x]   Health maintenance and immunizations reviewed. Please refer to Health maintenance section.   ***  , PA-C [facility]

## 2019-08-03 ENCOUNTER — Encounter: Payer: Self-pay | Admitting: Physician Assistant

## 2020-04-27 IMAGING — CT CT HEART SCORING
2 series · 16 of 20 positions shown, 18 images · non-contrast
Comparison: None.

Addendum:
EXAM:
OVER-READ INTERPRETATION  CT CHEST

The following report is an over-read performed by radiologist Dr.
Amnon Tiger [REDACTED] on 11/30/2017. This
over-read does not include interpretation of cardiac or coronary
anatomy or pathology. The coronary calcium score interpretation by
the cardiologist is attached.
CLINICAL DATA: Risk stratification
Coronary Calcium Score
TECHNIQUE: The patient was scanned on a Siemens Somatom 64 slice scanner. Axial
non-contrast 3 mm slices were carried out through the heart. The
data set was analyzed on a dedicated work station and scored using
the Agatson method.

[Series 2: casc 3.0 i36f 2 bestdiast 66 % · axial · 0.34mm/px · z∈[-313,-199]mm · 8 of 50 slices shown, 10 images]
[im 6/50  vessel]
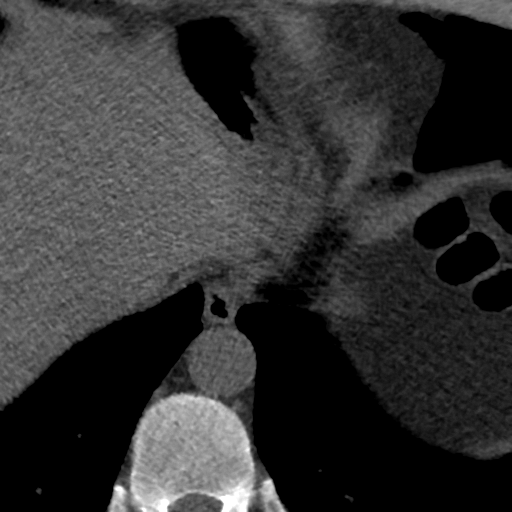
[im 6/50  lung]
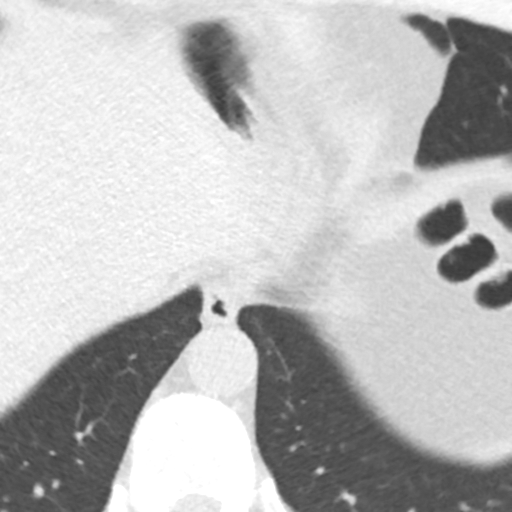
[im 11/50  vessel]
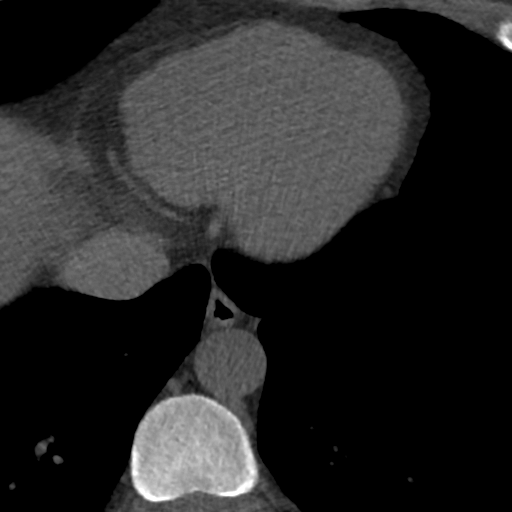
[im 17/50  vessel]
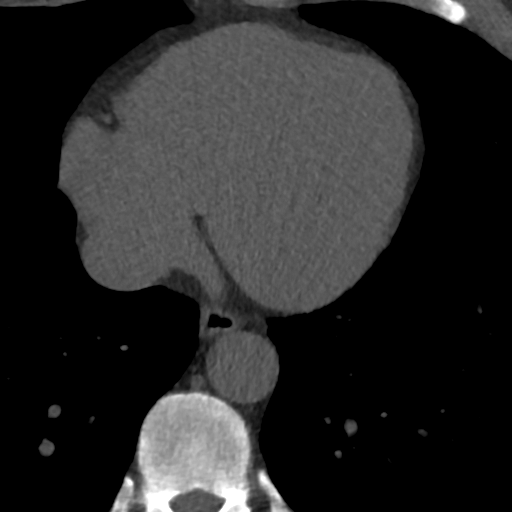
[im 22/50  vessel]
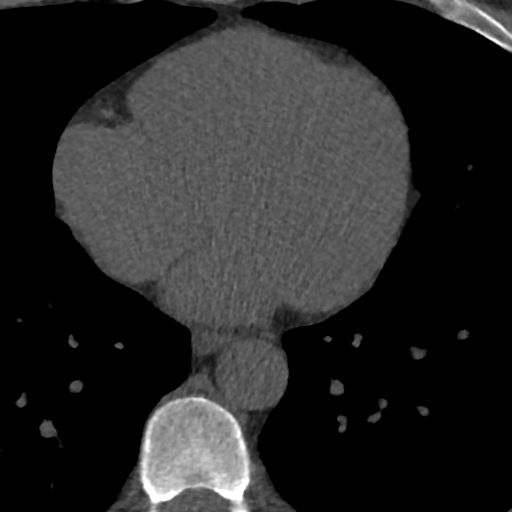
[im 28/50  vessel]
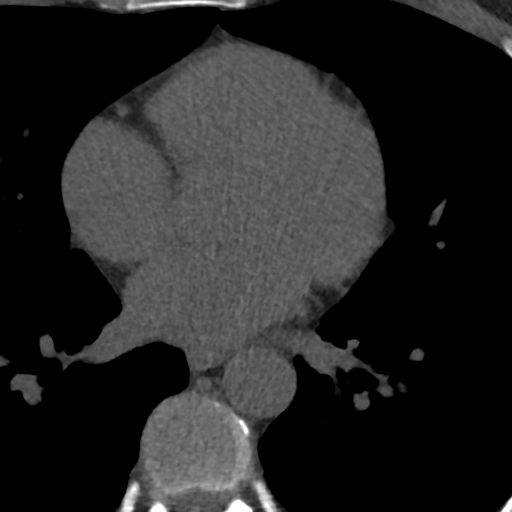
[im 28/50  lung]
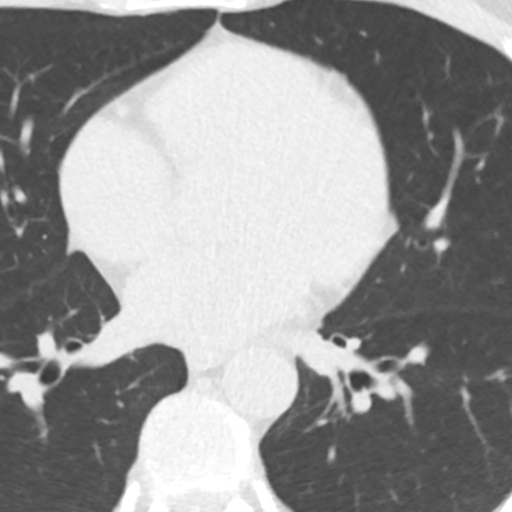
[im 33/50  vessel]
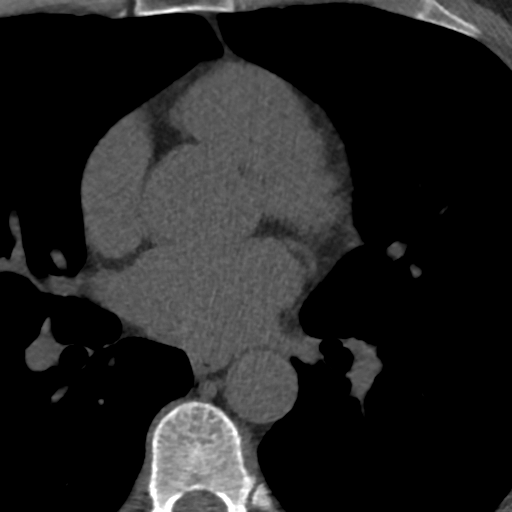
[im 39/50  vessel]
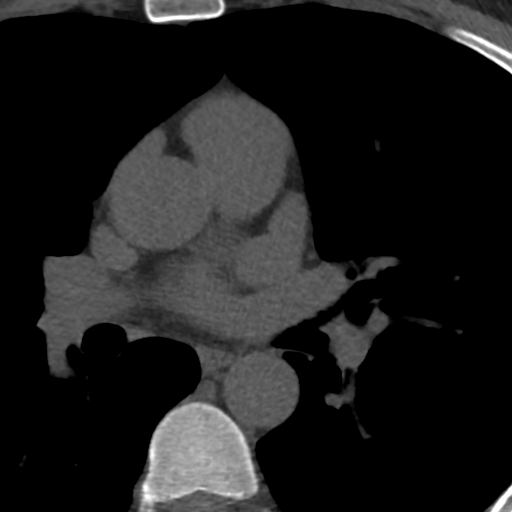
[im 44/50  vessel]
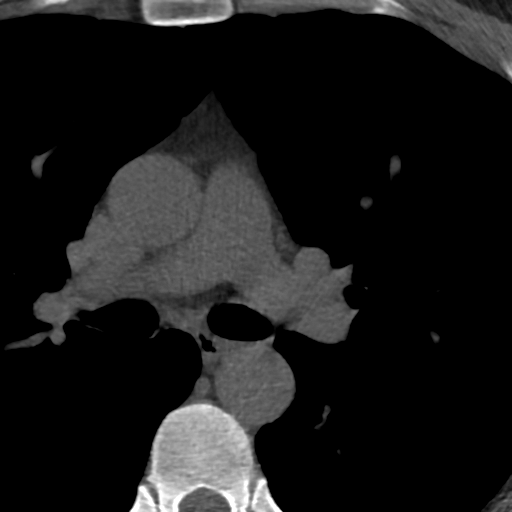

[Series 4: lung st 67 % · axial · 0.73mm/px · z∈[-312,-198]mm · 8 of 50 slices shown]
[im 6/50  lung]
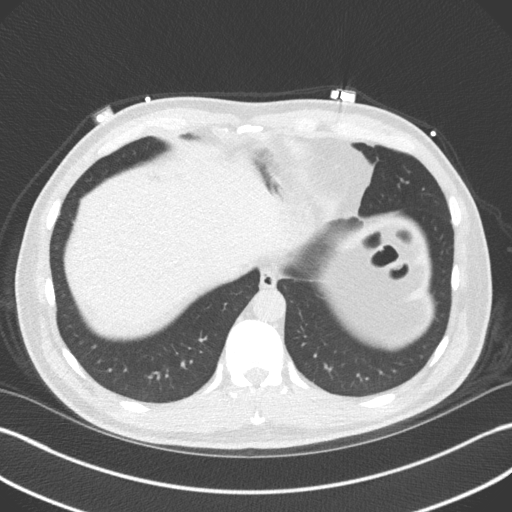
[im 11/50  lung]
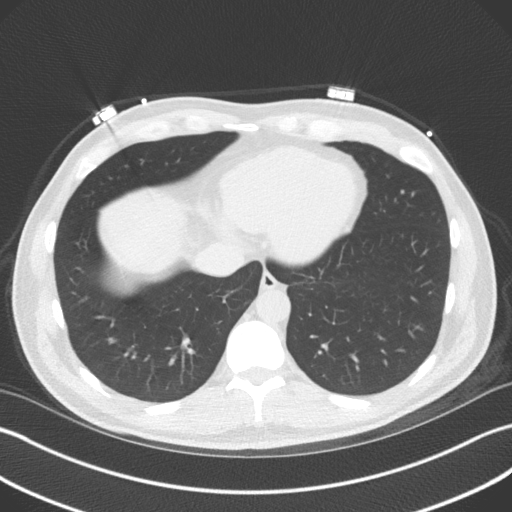
[im 17/50  lung]
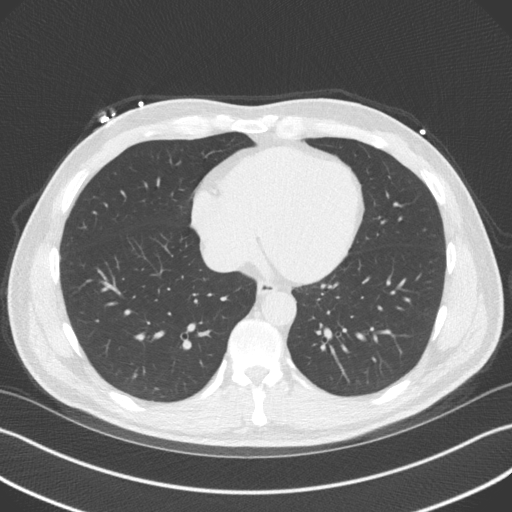
[im 22/50  lung]
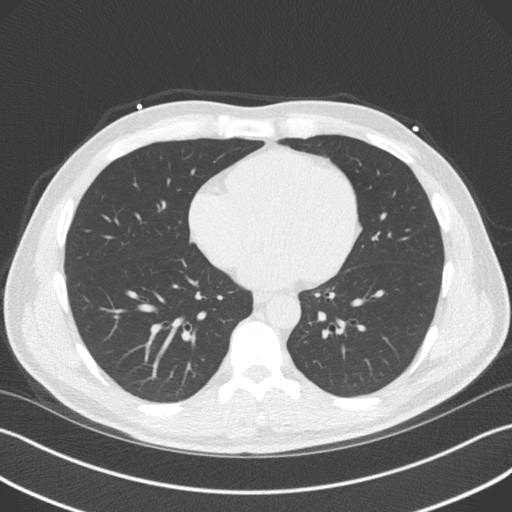
[im 28/50  lung]
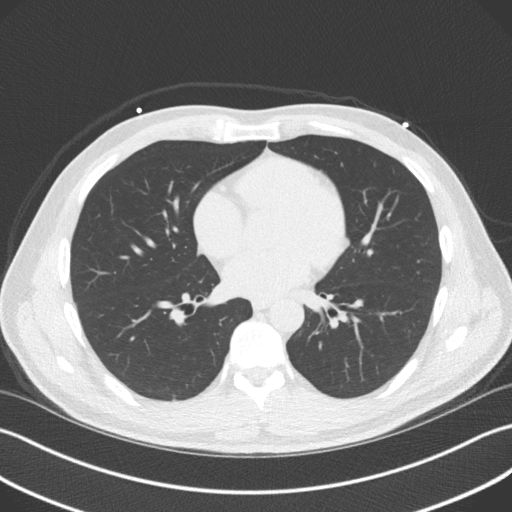
[im 33/50  lung]
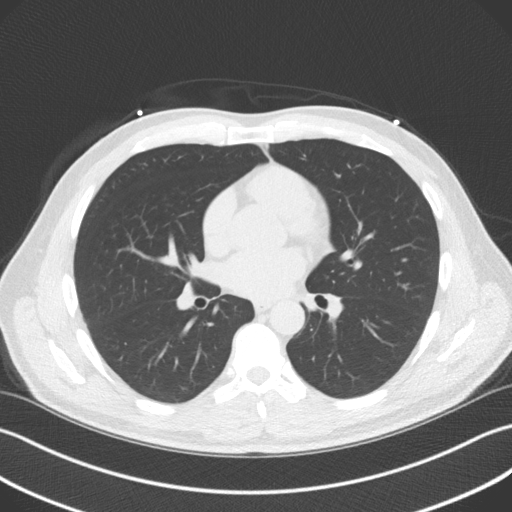
[im 39/50  lung]
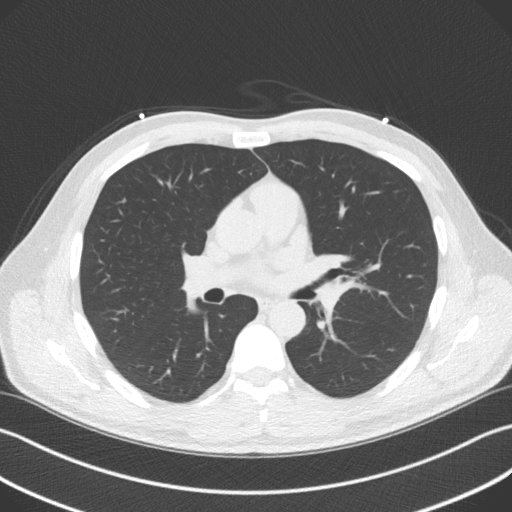
[im 44/50  lung]
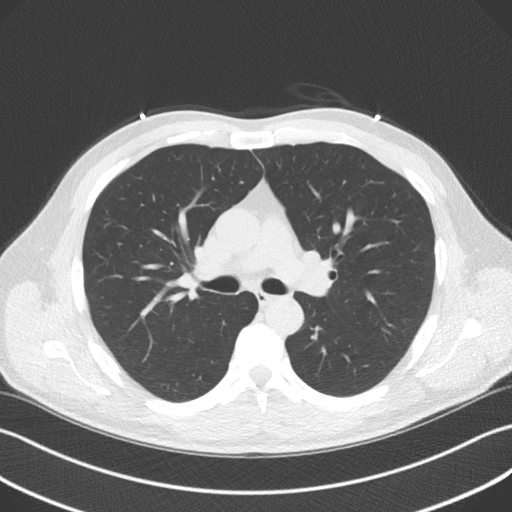

[16 of 20 positions shown; findings below may reference images not displayed]

FINDINGS: Vascular: Heart is normal size.  Visualized aorta is normal caliber.

Mediastinum/Nodes: No adenopathy in the lower mediastinum or hila.

Lungs/Pleura: Visualized lungs clear.  No effusions.

Upper Abdomen: Imaging into the upper abdomen shows no acute
findings.

Musculoskeletal: Chest wall soft tissues are unremarkable. No acute
bony abnormality.
IMPRESSION: No acute or significant extracardiac abnormality.
FINDINGS: Non-cardiac: See separate report from [REDACTED].

Ascending aorta: Normal diameter 3.1 cm

Pericardium: Normal

Coronary arteries: No calcium noted
IMPRESSION: Coronary calcium score of 0.

Sadaf Lemus

*** End of Addendum ***

## 2020-12-02 IMAGING — DX RIGHT ANKLE - COMPLETE 3+ VIEW
2 series · 2 of 2 positions shown · non-contrast
Comparison: June 04, 2017

CLINICAL DATA: Acute right ankle pain.

EXAM:
RIGHT ANKLE - COMPLETE 3+ VIEW

[ankle ap]
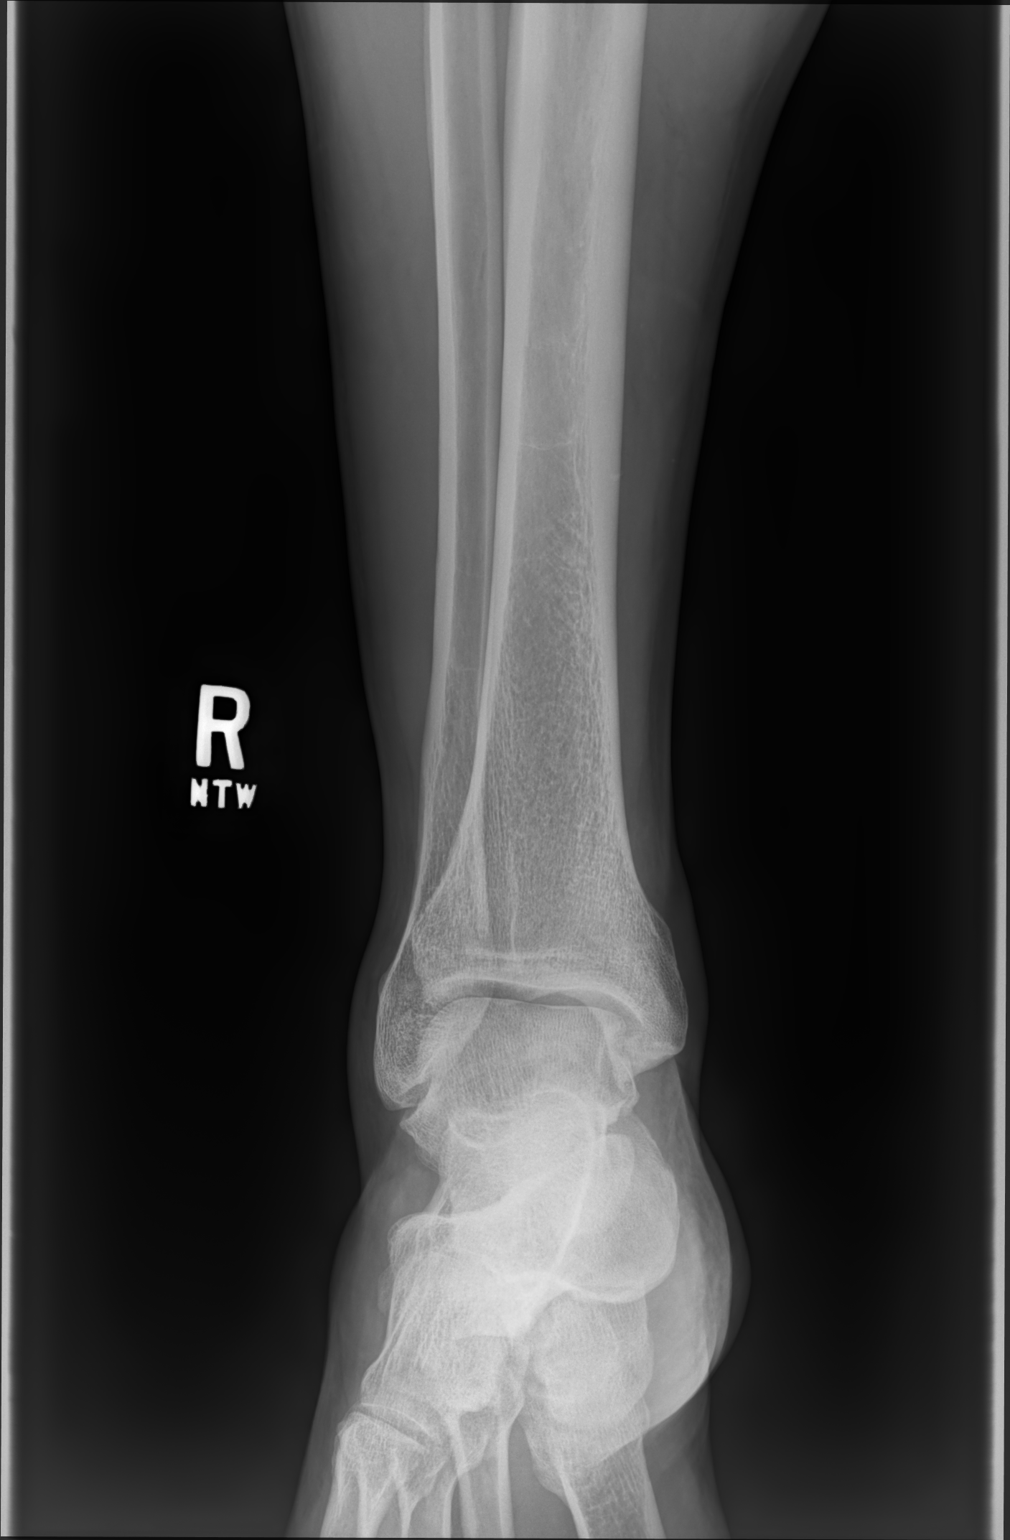

[ankle lat]
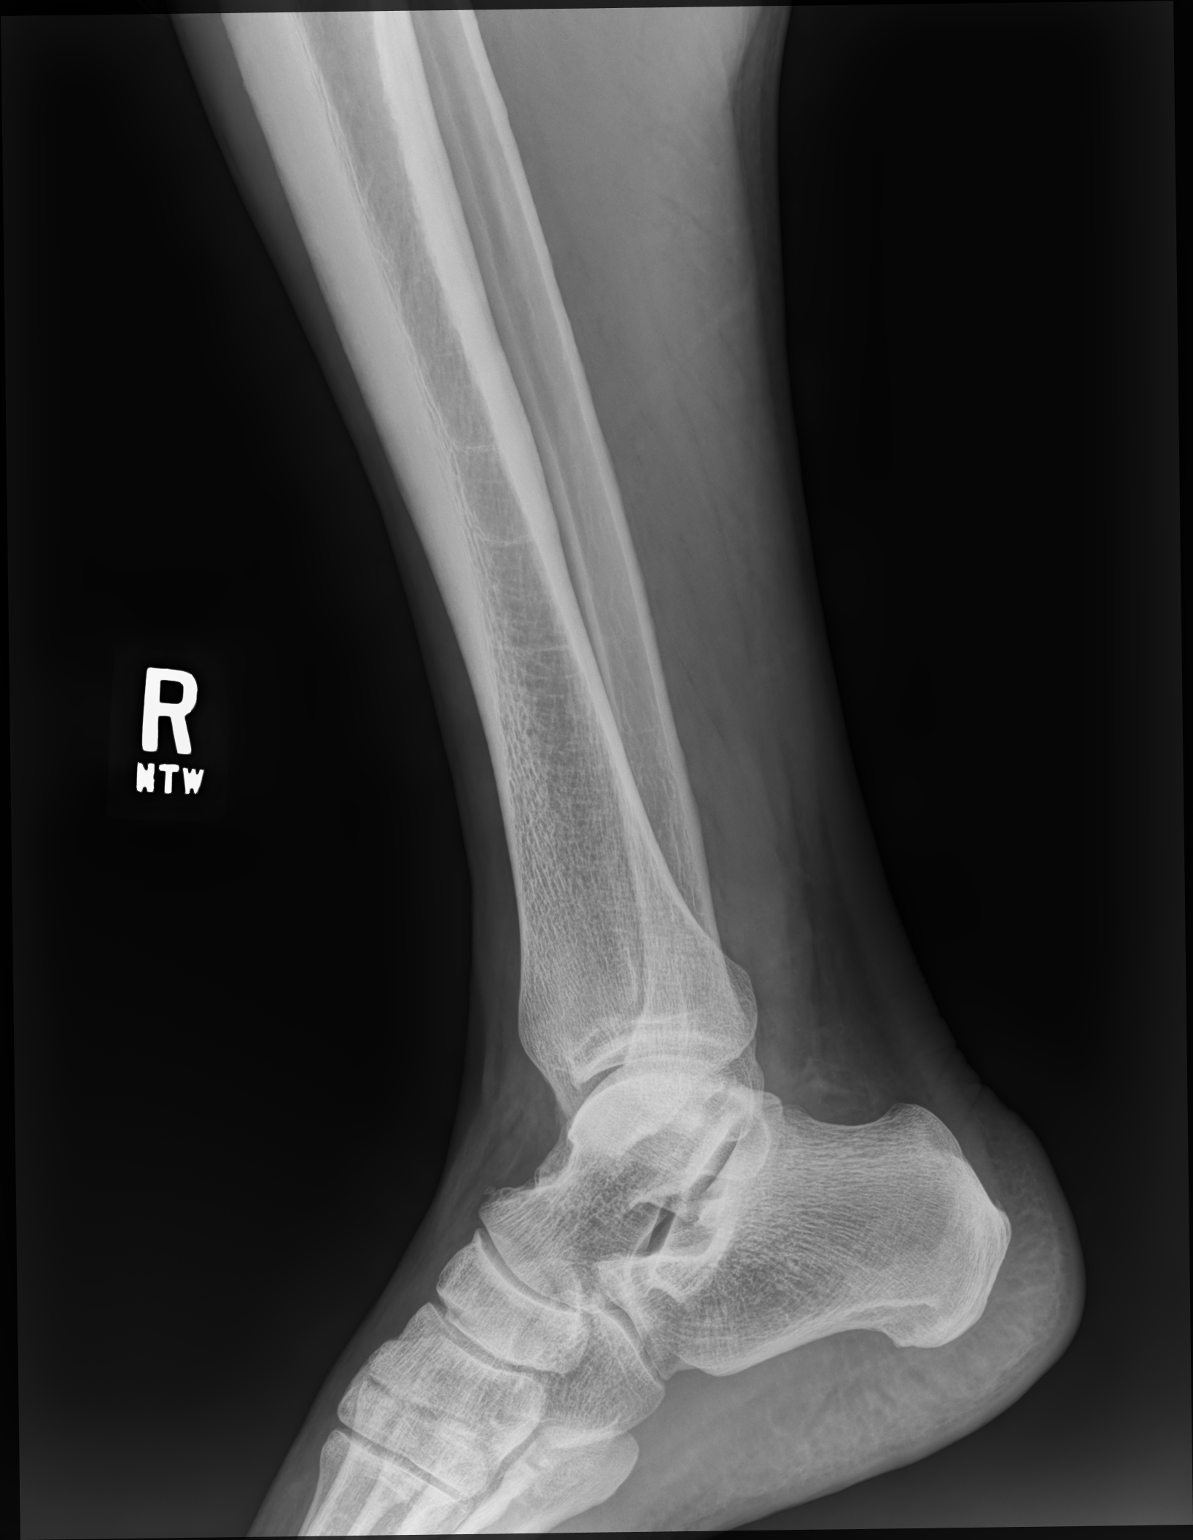

[2 of 2 positions shown; findings below may reference images not displayed]

FINDINGS: There is no evidence of fracture, dislocation, or joint effusion.
Soft tissues are unremarkable.
IMPRESSION: No acute fracture or dislocation noted.

## 2021-09-29 ENCOUNTER — Encounter: Payer: Self-pay | Admitting: *Deleted

## 2021-12-18 ENCOUNTER — Encounter: Payer: Self-pay | Admitting: *Deleted
